# Patient Record
Sex: Male | Born: 1995
Health system: Southern US, Community
[De-identification: ages and names within clinical notes are randomized; demographics above are authoritative.]

## PROBLEM LIST (undated history)

## (undated) DIAGNOSIS — R5600 Simple febrile convulsions: Secondary | ICD-10-CM

## (undated) DIAGNOSIS — J302 Other seasonal allergic rhinitis: Secondary | ICD-10-CM

## (undated) DIAGNOSIS — S62309A Unspecified fracture of unspecified metacarpal bone, initial encounter for closed fracture: Secondary | ICD-10-CM

## (undated) HISTORY — PX: HAND SURGERY: SHX662

---
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
## 2015-01-03 ENCOUNTER — Encounter: Payer: Self-pay | Admitting: Internal Medicine

## 2015-01-03 ENCOUNTER — Ambulatory Visit (INDEPENDENT_AMBULATORY_CARE_PROVIDER_SITE_OTHER): Payer: BLUE CROSS/BLUE SHIELD | Admitting: Internal Medicine

## 2015-01-03 ENCOUNTER — Telehealth: Payer: Self-pay | Admitting: Internal Medicine

## 2015-01-03 VITALS — BP 106/62 | HR 89 | Temp 98.8°F | Ht 68.0 in | Wt 141.0 lb

## 2015-01-03 DIAGNOSIS — B2790 Infectious mononucleosis, unspecified without complication: Secondary | ICD-10-CM

## 2015-01-03 DIAGNOSIS — R509 Fever, unspecified: Secondary | ICD-10-CM | POA: Diagnosis not present

## 2015-01-03 DIAGNOSIS — R112 Nausea with vomiting, unspecified: Secondary | ICD-10-CM

## 2015-01-03 DIAGNOSIS — J029 Acute pharyngitis, unspecified: Secondary | ICD-10-CM | POA: Diagnosis not present

## 2015-01-03 LAB — CBC WITH DIFFERENTIAL/PLATELET
BASOS ABS: 0 10*3/uL (ref 0.0–0.1)
BASOS PCT: 0 % (ref 0–1)
EOS ABS: 0 10*3/uL (ref 0.0–0.7)
Eosinophils Relative: 0 % (ref 0–5)
HCT: 40.8 % (ref 39.0–52.0)
HEMOGLOBIN: 13.9 g/dL (ref 13.0–17.0)
Lymphocytes Relative: 29 % (ref 12–46)
Lymphs Abs: 2.3 10*3/uL (ref 0.7–4.0)
MCH: 30.2 pg (ref 26.0–34.0)
MCHC: 34.1 g/dL (ref 30.0–36.0)
MCV: 88.7 fL (ref 78.0–100.0)
MONOS PCT: 15 % — AB (ref 3–12)
MPV: 10.4 fL (ref 8.6–12.4)
Monocytes Absolute: 1.2 10*3/uL — ABNORMAL HIGH (ref 0.1–1.0)
NEUTROS ABS: 4.4 10*3/uL (ref 1.7–7.7)
NEUTROS PCT: 56 % (ref 43–77)
PLATELETS: 158 10*3/uL (ref 150–400)
RBC: 4.6 MIL/uL (ref 4.22–5.81)
RDW: 12.7 % (ref 11.5–15.5)
WBC: 7.8 10*3/uL (ref 4.0–10.5)

## 2015-01-03 LAB — POCT RAPID STREP A (OFFICE): RAPID STREP A SCREEN: NEGATIVE

## 2015-01-03 LAB — MONONUCLEOSIS SCREEN: MONO SCREEN: POSITIVE — AB

## 2015-01-03 MED ORDER — CLARITHROMYCIN 250 MG PO TABS: 250.0000 mg | ORAL_TABLET | Freq: Two times a day (BID) | ORAL | Status: DC

## 2015-01-03 MED ORDER — PROMETHAZINE HCL 25 MG PO TABS: 25.0000 mg | ORAL_TABLET | Freq: Three times a day (TID) | ORAL | Status: DC | PRN

## 2015-01-03 NOTE — Telephone Encounter (Signed)
Mom called, had requested note for school earlier.  Rosalita Chessman reviewed labs with her.  Went over the recommendations from Dr. Lenord Fellers for the patient.    Mom called back stating she had been in touch with the nurse at patient's college and would be in touch with the August Saucer of the college next week.  She is going to hold off on getting a note at this time (however, I had already prepared a note, so disregard note in patient's chart) for the patient.  She is going to keep him home for 7-10 days and see how he progresses and will touch base with the school nurse and then see how he's doing.  Advised that we normally do not take the patient out of school as long as they are afebrile, we allow them to return to school.  We also called in a Rx for nausea for the patient.  Mom will go and pick that up.    Advised Mom that patient should not participate in contact sports, no diving for 2 weeks.  He should get plenty of rest for the next 2 weeks and resume normal activities thereafter.  She is concerned that he may need to be seen in follow up next week.  Reassured Mom that we are here if they need, however, this is a virus and it will just take time to recover from this.  She was instructed to call the office should they need [place].

## 2015-01-03 NOTE — Patient Instructions (Signed)
Take DayQuil and Tylenol as needed. Biaxin 250 mg twice daily for 10 days. CBC and Monospot test pending. Rest and drink plenty of fluids.

## 2015-01-03 NOTE — Progress Notes (Addendum)
   Subjective:    Patient ID: Gary Hamilton, male    DOB: 20-Jul-1995, 19 y.o.   MRN: [id number]  HPI Onset Monday, October 3 of myalgias, fever, cough congestion and sore throat. Has felt awful and actually came home from school. Yesterday had temperature up to 104. Was on Fall break last week. Mother says he's had a cough for a few weeks now. No headache or stiff neck.   Review of Systems     Objective:   Physical Exam Pharynx is red without exudate. TMs are slightly full bilaterally but not red. Neck is supple without significant adenopathy. Chest is clear to auscultation without rales or wheezing       Assessment & Plan:  Acute pharyngitis-consider mononucleosis  Plan: CBC with differential and Monospot test drawn. Biaxin 250 mg twice daily for 10 days. Take with food. Rest and drink plenty of fluids. Tylenol as needed for fever.  Addendum: Monospot test is positive. CBC is within normal limits. He will take Biaxin. Out of school until Wednesday, October 12. Then he has to rest at school and not displayed in physical education or other sports activities for 2 weeks. No contact sports. No diving.

## 2015-01-03 NOTE — Addendum Note (Signed)
Addended by: Thomasena Edis on: 01/03/2015 04:24 PM   Modules accepted: Orders

## 2015-01-07 ENCOUNTER — Telehealth: Payer: Self-pay | Admitting: Internal Medicine

## 2015-01-07 NOTE — Telephone Encounter (Signed)
Mom calls this morning states that she drove patient back to [facility] yesterday so that he could attend classes.   She's staying there this week with the patient.  He is attending class and then coming back to the motel with her to rest.  States that he's still not able to keep much food down.  States that he's having terrible throat pain and has significant congestion.  States that he cannot rest at night because he's coughing the majority of the night.  Advised that with her being back there in [place], it will be difficult to manage his care over the telephone.  He likely needs something stronger for his cough than an OTC cough syrup she requested.  I advised that it would require that she pick up the prescription here at the office.  She said they would be coming home for the weekend.  Patient has a lab on Friday that he has every 2 weeks and patient stated that he CANNOT miss this lab and it lasts until 5:30 p.m.  I asked her if Monday morning was an option for the patient to be seen by Dr. Lenord Fellers prior to her taking him back to school to [place].  She said that was a possibility.    Then, advised if patient is that bad, perhaps she should take him to an Urgent Care there in the area where they are (not the school [facility]irm[facility]).  She said she would most likely go ahead and take him this afternoon to an Urgent Care and see if they recommended an antibiotic for him and cough syrup for him or steroid for the congestion and sore throat symptoms that he seems to be having.    She said that Dr. Lenord Fellers indicated that by Sunday he should begin to feel some relief and be feeling better.  She advised that he's not feeling better, he's really feeling worse if anything.  However, patient stated that he really couldn't afford to miss classes.    She will keep [place] updated on his progress and if she feels he needs to be seen on Monday, 10/17, she was instructed to call our office later in the week to get him on the calendar  for Monday morning.

## 2015-08-12 ENCOUNTER — Telehealth: Payer: Self-pay | Admitting: Internal Medicine

## 2015-08-12 NOTE — Telephone Encounter (Signed)
Pt's mom called stating that he's had a stomach bug for the last 5 days. Went to Urgent Care on 08/10/15 and was given some anti nausea and anti diarrhea medication. Pt doesn't have a fever and is no longer vomiting, but is still unable to keep anything on his stomach or eat much. Pt stated that he felt really weak. Mom was wondering if he could be tested for a bacterial infection or if this was, "one of those things that has to work through your system?" Please advise.

## 2015-08-12 NOTE — Telephone Encounter (Signed)
Called Mother. Sounds like viral gastroenteritis. Has nausea med. Must stay with clear liquids and hydrate self. Call if not better in 2 days. Dx with gastroenteritis at urgent care.

## 2018-10-05 ENCOUNTER — Telehealth: Payer: Self-pay | Admitting: Internal Medicine

## 2018-10-05 ENCOUNTER — Ambulatory Visit (INDEPENDENT_AMBULATORY_CARE_PROVIDER_SITE_OTHER): Payer: 59 | Admitting: Internal Medicine

## 2018-10-05 ENCOUNTER — Telehealth: Payer: Self-pay | Admitting: General Practice

## 2018-10-05 ENCOUNTER — Other Ambulatory Visit: Payer: Self-pay

## 2018-10-05 DIAGNOSIS — Z20822 Contact with and (suspected) exposure to covid-19: Secondary | ICD-10-CM

## 2018-10-05 DIAGNOSIS — Z20828 Contact with and (suspected) exposure to other viral communicable diseases: Secondary | ICD-10-CM

## 2018-10-05 NOTE — Telephone Encounter (Signed)
Agree 

## 2018-10-05 NOTE — Progress Notes (Signed)
   Subjective:    Patient ID: Gary Hamilton, male    DOB: 1995-09-19, 23 y.o.   MRN: [id number]  HPI  23 year old  Male seen by interactive audio and video telecommunications today due to the coronavirus pandemic.  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  Patient states that he was at the beach with his family staying in a beach time and a cousin from [place] came to visit.  Apparently cousin complained of a swimmer's ear and then came down with flulike symptoms during their stay at the beach.  Cousin left to go back to [place] on July 5th.  Patient and his family have become concerned that they were exposed to COVID-19 because cousin was tested after he returned to [place] and was positive.  Patient currently has no symptoms of COVID-19.  He is still wanting to be tested.    Review of Systems no documented fever, chills, myalgias, headache, nausea or vomiting.     Objective:   Physical Exam  Seen virtually in no acute distress      Assessment & Plan:  COVID-19 exposure  Plan: Arrange for testing at [facility] test center.  Patient should quarantine at home until test results are obtained.  Total of 20 minutes spent with patient in interviewing and history taking with virtual visit, time spent with medical decision and time spent making arrangements for testing at [facility].

## 2018-10-05 NOTE — Telephone Encounter (Addendum)
Kawon Willcutt [phone]  Gwin was at the beach with his cousin, who has tested positive for COVID-19. He left on Sunday and his cousin started running fever that afternoon. He is experiencing no symptoms at this time. He would like to be tested before going back to work. He works at the [employer] and is supposed to go back on Monday. I suggested Virtual Visit.

## 2018-10-05 NOTE — Telephone Encounter (Signed)
Called [facility] to schedule COVID - 19 testing

## 2018-10-05 NOTE — Telephone Encounter (Signed)
Gary Hamilton with Dr Ardelle Park office calling and states that Dr Renold Genta would like this patient scheduled for COVID testing. States that he was exposed this weekend at the beach while there with his mother and 7 others. States that he is not having any symptoms at this time. Please advise.

## 2018-10-05 NOTE — Telephone Encounter (Signed)
Scheduled Virtual visit °

## 2018-10-06 ENCOUNTER — Other Ambulatory Visit: Payer: Self-pay | Admitting: Internal Medicine

## 2018-10-06 DIAGNOSIS — Z20822 Contact with and (suspected) exposure to covid-19: Secondary | ICD-10-CM

## 2018-10-06 NOTE — Telephone Encounter (Signed)
error 

## 2018-10-06 NOTE — Telephone Encounter (Signed)
Contacted pt, he states he already went to the [facility] testing site around 9:30 this morning and was tested. Nothing further is needed

## 2018-10-09 ENCOUNTER — Encounter: Payer: Self-pay | Admitting: Internal Medicine

## 2018-10-09 ENCOUNTER — Other Ambulatory Visit: Payer: 59

## 2018-10-09 DIAGNOSIS — Z20822 Contact with and (suspected) exposure to covid-19: Secondary | ICD-10-CM

## 2018-10-09 NOTE — Patient Instructions (Signed)
Have arranged for COVID-19 testing at test center.  Should quarantine at home until results are received.

## 2018-10-10 ENCOUNTER — Encounter: Payer: Self-pay | Admitting: Internal Medicine

## 2018-10-10 ENCOUNTER — Telehealth (INDEPENDENT_AMBULATORY_CARE_PROVIDER_SITE_OTHER): Payer: 59 | Admitting: Internal Medicine

## 2018-10-10 DIAGNOSIS — U071 COVID-19: Secondary | ICD-10-CM

## 2018-10-10 LAB — NOVEL CORONAVIRUS, NAA: SARS-CoV-2, NAA: DETECTED — AB

## 2018-10-10 NOTE — Telephone Encounter (Signed)
Phone call to pt. His Covid 19 test is positive. He remains asymptomatic. However, he is working in a health care setting. I have suggested he remain out x 10 days and get retested on July 20th. He is to notify me if he develops any symptoms at all.  Other family members have been tested and are awaiting results but are still asymptomatic.  10 minutes spent with decision making, calling pt and discussing situation and decision making.

## 2018-10-13 ENCOUNTER — Telehealth: Payer: Self-pay | Admitting: Internal Medicine

## 2018-10-13 ENCOUNTER — Ambulatory Visit (INDEPENDENT_AMBULATORY_CARE_PROVIDER_SITE_OTHER): Payer: 59 | Admitting: Internal Medicine

## 2018-10-13 DIAGNOSIS — U071 COVID-19: Secondary | ICD-10-CM | POA: Diagnosis not present

## 2018-10-13 NOTE — Telephone Encounter (Signed)
Scheduled virtual visit °

## 2018-10-13 NOTE — Telephone Encounter (Signed)
Set up virtual visit 

## 2018-10-13 NOTE — Telephone Encounter (Signed)
Gary Hamilton [phone]  Gary called to say that he needs to get tested again, that his work is requiring 2 negative COVID test more than 24 hours apart to return to work.

## 2018-10-16 ENCOUNTER — Other Ambulatory Visit: Payer: Self-pay | Admitting: Internal Medicine

## 2018-10-16 DIAGNOSIS — U071 COVID-19: Secondary | ICD-10-CM

## 2018-10-16 NOTE — Telephone Encounter (Signed)
LVM to call office, the orders have been put in for his COVID test 1st one on Wednesday 10/18/18 and 2nd one 10/20/18. All Gary Hamilton needs to do is go between 8-3:30 for testing.

## 2018-10-17 NOTE — Patient Instructions (Signed)
Continue to self quarantine at home and we will arrange for follow-up testing as requested by your employer.  Will likely be in 2 weeks getting these test results back due to backlog of testing

## 2018-10-17 NOTE — Progress Notes (Signed)
   Subjective:    Patient ID: Gary Hamilton, male    DOB: 1995/07/05, 23 y.o.   MRN: [id number]  HPI He is seen today by interactive audio and video telecommunications due to the coronavirus pandemic and for the fact that he has COVID-19 positive.  However he is completely asymptomatic.  He is identified is [employer] using 2 identifiers and is a patient in this practice.  He is agreeable to visit in this format today.  He has been quarantining at home since he discovered his cousin with whom he was spending time at the beach tested positive for COVID-19 and had symptoms.  Gary Hamilton has never developed any symptoms at all.  His parents were tested and they were both negative.  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  Unfortunately it is taking some 10 days or more to get the results of these tests back.  He is aware of this and is aware he will be out of work probably for another 2 weeks in order to get this testing done.    Review of Systems See above    Objective:   Physical Exam  He is seen virtually and looks well.  No symptoms of COVID-19      Assessment & Plan:  Asymptomatic COVID-19 positive patient  Plan: Continue to monitor at home with self quarantine.  We will arrange for follow-up testing as he indicates he needs to return to work.  15 minutes spent with patient.  This includes medical decision making as well.

## 2018-10-18 ENCOUNTER — Other Ambulatory Visit: Payer: Self-pay | Admitting: Internal Medicine

## 2018-10-18 DIAGNOSIS — Z20822 Contact with and (suspected) exposure to covid-19: Secondary | ICD-10-CM

## 2018-10-22 LAB — NOVEL CORONAVIRUS, NAA: SARS-CoV-2, NAA: NOT DETECTED

## 2019-03-07 ENCOUNTER — Telehealth: Payer: Self-pay

## 2019-03-07 NOTE — Telephone Encounter (Signed)
-----   Message from Irene Shipper, MD sent at 03/07/2019 11:47 AM EST ----- Regarding: Office appointment this week Linda,Family friend.  His mother contacted me saying that [place] was having recurrent rectal bleeding.  Please contact Chett on his cell phone and schedule him to see me this Friday at 4 PM.  Thanks jp

## 2019-03-07 NOTE — Telephone Encounter (Signed)
Pt scheduled to see Dr. Henrene Pastor 03/09/19@4pm , pt aware of appt,

## 2019-03-09 ENCOUNTER — Ambulatory Visit: Payer: 59 | Admitting: Internal Medicine

## 2019-03-09 ENCOUNTER — Encounter: Payer: Self-pay | Admitting: Internal Medicine

## 2019-03-09 ENCOUNTER — Other Ambulatory Visit: Payer: Self-pay

## 2019-03-09 VITALS — BP 102/78 | HR 76 | Temp 98.5°F | Ht 68.0 in | Wt 155.0 lb

## 2019-03-09 DIAGNOSIS — K625 Hemorrhage of anus and rectum: Secondary | ICD-10-CM

## 2019-03-09 DIAGNOSIS — Z1159 Encounter for screening for other viral diseases: Secondary | ICD-10-CM

## 2019-03-09 MED ORDER — NA SULFATE-K SULFATE-MG SULF 17.5-3.13-1.6 GM/177ML PO SOLN: 1.0000 | Freq: Once | ORAL | 0 refills | Status: AC

## 2019-03-09 NOTE — Patient Instructions (Signed)
You have been scheduled for a colonoscopy. Please follow written instructions given to you at your visit today.  Please pick up your prep supplies at the pharmacy within the next 1-3 days. If you use inhalers (even only as needed), please bring them with you on the day of your procedure.   

## 2019-03-09 NOTE — Progress Notes (Signed)
HISTORY OF PRESENT ILLNESS:  Gary Hamilton is a 23 y.o. male with no significant past medical history.  Asymptomatic Covid positive in July (tested due to positive contact).  Presents now with recurrent rectal bleeding.  He describes having rectal bleeding 6 months ago which lasted 1 week.  Occurred with each bowel movement.  Painless.  No obvious hemorrhoids.  No change in bowel habits or constipation.  No rectal pain.  No weight loss.  Did well until 5 days ago when he experienced recurrent bleeding for 2-1/2 days.  Same characteristics.  No bleeding the past 2 days.  No family history of colon cancer or inflammatory bowel disease.  No recent blood work  REVIEW OF SYSTEMS:  All non-GI ROS negative entirely  Past Medical History:  Diagnosis Date  . Febrile seizure ([place]) as an infant   x 1  . Fracture of metacarpal 07/30/2012   right long and ring  . Seasonal allergies     Past Surgical History:  Procedure Laterality Date  . HAND SURGERY    . SHOULDER ARTHROSCOPY  2016    Social History Gary Hamilton  reports that he has never smoked. He has never used smokeless tobacco. He reports current alcohol use. He reports that he does not use drugs.  family history includes Hypertension in his mother.  No Known Allergies     PHYSICAL EXAMINATION: Vital signs: BP 102/78   Pulse 76   Temp 98.5 F (36.9 C)   Ht 5\' 8"  (1.727 m)   Wt 155 lb (70.3 kg)   BMI 23.57 kg/m   Constitutional: generally well-appearing, no acute distress Psychiatric: alert and oriented x3, cooperative Eyes: extraocular movements intact, anicteric, conjunctiva pink Mouth: oral pharynx moist, no lesions Neck: supple no lymphadenopathy Cardiovascular: heart regular rate and rhythm, no murmur Lungs: clear to auscultation bilaterally Abdomen: soft, nontender, nondistended, no obvious ascites, no peritoneal signs, normal bowel sounds, no organomegaly Rectal: Deferred until colonoscopy Extremities: no lower  extremity edema bilaterally Skin: no lesions on visible extremities Neuro: No focal deficits. No asterixis.    ASSESSMENT:  1.  Recurrent rectal bleeding of uncertain etiology.  Based on history, rule out polypoid lesion or neoplasia, most importantly.   PLAN:  1.  Colonoscopy.The nature of the procedure, as well as the risks, benefits, and alternatives were carefully and thoroughly reviewed with the patient. Ample time for discussion and questions allowed. The patient understood, was satisfied, and agreed to proceed.

## 2019-03-19 ENCOUNTER — Encounter: Payer: Self-pay | Admitting: Internal Medicine

## 2019-03-19 ENCOUNTER — Ambulatory Visit (INDEPENDENT_AMBULATORY_CARE_PROVIDER_SITE_OTHER): Payer: 59

## 2019-03-19 ENCOUNTER — Other Ambulatory Visit: Payer: Self-pay | Admitting: Internal Medicine

## 2019-03-19 DIAGNOSIS — Z1159 Encounter for screening for other viral diseases: Secondary | ICD-10-CM

## 2019-03-20 LAB — SARS CORONAVIRUS 2 (TAT 6-24 HRS): SARS Coronavirus 2: NEGATIVE

## 2019-03-21 ENCOUNTER — Encounter: Payer: Self-pay | Admitting: Internal Medicine

## 2019-03-21 ENCOUNTER — Other Ambulatory Visit: Payer: Self-pay

## 2019-03-21 ENCOUNTER — Ambulatory Visit (AMBULATORY_SURGERY_CENTER): Payer: 59 | Admitting: Internal Medicine

## 2019-03-21 VITALS — BP 102/45 | HR 78 | Temp 98.1°F | Resp 12 | Ht 68.0 in | Wt 165.0 lb

## 2019-03-21 DIAGNOSIS — K625 Hemorrhage of anus and rectum: Secondary | ICD-10-CM | POA: Diagnosis present

## 2019-03-21 MED ORDER — SODIUM CHLORIDE 0.9 % IV SOLN: 500.0000 mL | INTRAVENOUS | Status: DC

## 2019-03-21 NOTE — Patient Instructions (Signed)
Normal colonoscopy. Fiber fortified diet. Please read high fiber diet hand-out.     YOU HAD AN ENDOSCOPIC PROCEDURE TODAY AT [facility]:   Refer to the procedure report that was given to you for any specific questions about what was found during the examination.  If the procedure report does not answer your questions, please call your gastroenterologist to clarify.  If you requested that your care partner not be given the details of your procedure findings, then the procedure report has been included in a sealed envelope for you to review at your convenience later.  YOU SHOULD EXPECT: Some feelings of bloating in the abdomen. Passage of more gas than usual.  Walking can help get rid of the air that was put into your GI tract during the procedure and reduce the bloating. If you had a lower endoscopy (such as a colonoscopy or flexible sigmoidoscopy) you may notice spotting of blood in your stool or on the toilet paper. If you underwent a bowel prep for your procedure, you may not have a normal bowel movement for a few days.  Please Note:  You might notice some irritation and congestion in your nose or some drainage.  This is from the oxygen used during your procedure.  There is no need for concern and it should clear up in a day or so.  SYMPTOMS TO REPORT IMMEDIATELY:   Following lower endoscopy (colonoscopy or flexible sigmoidoscopy):  Excessive amounts of blood in the stool  Significant tenderness or worsening of abdominal pains  Swelling of the abdomen that is new, acute  Fever of 100F or higher   For urgent or emergent issues, a gastroenterologist can be reached at any hour by calling [phone].   DIET:  We do recommend a small meal at first, but then you may proceed to your regular diet.  Drink plenty of fluids but you should avoid alcoholic beverages for 24 hours.  ACTIVITY:  You should plan to take it easy for the rest of today and you should NOT DRIVE or use  heavy machinery until tomorrow (because of the sedation medicines used during the test).    FOLLOW UP: Our staff will call the number listed on your records 48-72 hours following your procedure to check on you and address any questions or concerns that you may have regarding the information given to you following your procedure. If we do not reach you, we will leave a message.  We will attempt to reach you two times.  During this call, we will ask if you have developed any symptoms of COVID 19. If you develop any symptoms (ie: fever, flu-like symptoms, shortness of breath, cough etc.) before then, please call [phone].  If you test positive for Covid 19 in the 2 weeks post procedure, please call and report this information to [place].    If any biopsies were taken you will be contacted by phone or by letter within the next 1-3 weeks.  Please call us at [phone] if you have not heard about the biopsies in 3 weeks.    SIGNATURES/CONFIDENTIALITY: You and/or your care partner have signed paperwork which will be entered into your electronic medical record.  These signatures attest to the fact that that the information above on your After Visit Summary has been reviewed and is understood.  Full responsibility of the confidentiality of this discharge information lies with you and/or your care-partner.

## 2019-03-21 NOTE — Progress Notes (Signed)
A and O x3. Report to RN. Tolerated MAC anesthesia well.

## 2019-03-21 NOTE — Op Note (Signed)
[facility] Patient Name: Gary Hamilton Procedure Date: 03/21/2019 3:09 PM MRN: [id number] Endoscopist: Wilhemina Bonito. Marina Goodell , MD Age: 23 Referring MD:  Date of Birth: 11/09/1995 Gender: Male Account #: [id number] Procedure:                Colonoscopy Indications:              Rectal bleeding. Recurrent. As recently as this                            past week Medicines:                Monitored Anesthesia Care Procedure:                Pre-Anesthesia Assessment:                           - Prior to the procedure, a History and Physical                            was performed, and patient medications and                            allergies were reviewed. The patient's tolerance of                            previous anesthesia was also reviewed. The risks                            and benefits of the procedure and the sedation                            options and risks were discussed with the patient.                            All questions were answered, and informed consent                            was obtained. Prior Anticoagulants: The patient has                            taken no previous anticoagulant or antiplatelet                            agents. ASA Grade Assessment: I - A normal, healthy                            patient. After reviewing the risks and benefits,                            the patient was deemed in satisfactory condition to                            undergo the procedure.  After obtaining informed consent, the colonoscope                            was passed under direct vision. Throughout the                            procedure, the patient's blood pressure, pulse, and                            oxygen saturations were monitored continuously. The                            Colonoscope was introduced through the anus and                            advanced to the the cecum, identified by     appendiceal orifice and ileocecal valve. The                            ileocecal valve, appendiceal orifice, and rectum                            were photographed. The quality of the bowel                            preparation was excellent. The colonoscopy was                            performed without difficulty. The patient tolerated                            the procedure well. The bowel preparation used was                            SUPREP via split dose instruction. Scope In: 3:27:27 PM Scope Out: 3:42:05 PM Scope Withdrawal Time: 0 hours 10 minutes 24 seconds  Total Procedure Duration: 0 hours 14 minutes 38 seconds  Findings:                 The entire examined colon appeared normal on direct                            and retroflexion views. Complications:            No immediate complications. Estimated blood loss:                            None. Estimated Blood Loss:     Estimated blood loss: none. Impression:               - Normal colonoscopy.                           - Internal hemorrhoid                           - No  specimens collected. Recommendation:           - Repeat colonoscopy at age 23 for screening                            purposes.                           - Patient has a contact number available for                            emergencies. The signs and symptoms of potential                            delayed complications were discussed with the                            patient. Return to normal activities tomorrow.                            Written discharge instructions were provided to the                            patient.                           - Resume previous diet. Fiber fortified.                           - Continue present medications. Wilhemina BonitoJohn N. Marina GoodellPerry, MD 03/21/2019 4:01:33 PM This report has been signed electronically.

## 2019-03-21 NOTE — Progress Notes (Signed)
Temp by JB and vitals by DT 

## 2019-03-26 ENCOUNTER — Telehealth: Payer: Self-pay | Admitting: *Deleted

## 2019-03-26 NOTE — Telephone Encounter (Signed)
  Follow up Call-  Call back number 03/21/2019  Post procedure Call Back phone  # [phone]  Permission to leave phone message Yes  Some recent data might be hidden     Patient questions:  Do you have a fever, pain , or abdominal swelling? No. Pain Score  0 *  Have you tolerated food without any problems? Yes.    Have you been able to return to your normal activities? Yes.    Do you have any questions about your discharge instructions: Diet   No. Medications  No. Follow up visit  No.  Do you have questions or concerns about your Care? No.  Actions: * If pain score is 4 or above: No action needed, pain <4.  1. Have you developed a fever since your procedure? no  2.   Have you had an respiratory symptoms (SOB or cough) since your procedure? no  3.   Have you tested positive for COVID 19 since your procedure no  4.   Have you had any family members/close contacts diagnosed with the COVID 19 since your procedure?  no   If yes to any of these questions please route to Joylene John, RN and Alphonsa Gin, Therapist, sports.

## 2019-04-18 DIAGNOSIS — M25312 Other instability, left shoulder: Secondary | ICD-10-CM | POA: Insufficient documentation

## 2019-07-20 LAB — TB SKIN TEST: TB Skin Test: NEGATIVE

## 2019-10-05 ENCOUNTER — Other Ambulatory Visit: Payer: 59 | Admitting: Internal Medicine

## 2019-10-12 ENCOUNTER — Encounter: Payer: 59 | Admitting: Internal Medicine

## 2019-11-20 ENCOUNTER — Telehealth: Payer: Self-pay | Admitting: Internal Medicine

## 2019-11-20 NOTE — Telephone Encounter (Signed)
Pt CB

## 2019-11-20 NOTE — Telephone Encounter (Signed)
Schedule OV to see results and complete

## 2019-11-20 NOTE — Telephone Encounter (Addendum)
Gary Hamilton [phone]  Gary Hamilton went to the Urgent Care at [facility] for his TB lab test. He has the results, but his school will not accept them with out a doctors signature. He would like to know if we can print something up and you sign with the results.

## 2019-11-20 NOTE — Telephone Encounter (Signed)
LVM to CB and schedule OV 

## 2019-11-23 ENCOUNTER — Encounter: Payer: Self-pay | Admitting: Internal Medicine

## 2019-11-23 ENCOUNTER — Other Ambulatory Visit: Payer: Self-pay

## 2019-11-23 ENCOUNTER — Ambulatory Visit: Payer: 59 | Admitting: Internal Medicine

## 2019-11-23 VITALS — Ht 68.0 in | Wt 151.0 lb

## 2019-11-23 DIAGNOSIS — Z0289 Encounter for other administrative examinations: Secondary | ICD-10-CM

## 2019-11-24 NOTE — Patient Instructions (Addendum)
Form validated regarding QuantiFERON testing for application to medical school.  It was signed by me personally today.  A copy of the form is in his chart.

## 2019-11-24 NOTE — Progress Notes (Signed)
Patient is applying to medical school and had to have a QuantiFERON test for TB done recently which he had at an urgent care.  Apparently, the result  has to be signed by a physician and he is requested that I do this.  He is seen today in the office in person.  Reviewed QuantiFERON test obtained from urgent care on his good charge operated by [facility] on July 20, 2019.  The result is negative.  The report appears valid containing his pertinent information.  Report was signed by me partially dated today.  His general health is excellent.  Patient had health maintenance exam here in July 2015.  He has no history of serious illnesses.  No known drug allergies.  No chronic medications.  Family history: Parents in good health.  Mother with history of hypertension.  He has history of left shoulder pain/dislocation seen by Dr. Rennis Chris.  History of rectal bleeding seen by Dr. Yancey Flemings.

## 2020-01-04 ENCOUNTER — Encounter: Payer: 59 | Admitting: Internal Medicine

## 2020-01-04 ENCOUNTER — Other Ambulatory Visit: Payer: 59 | Admitting: Internal Medicine

## 2020-01-04 ENCOUNTER — Other Ambulatory Visit: Payer: Self-pay

## 2020-01-04 DIAGNOSIS — Z13220 Encounter for screening for lipoid disorders: Secondary | ICD-10-CM

## 2020-01-04 DIAGNOSIS — Z0000 Encounter for general adult medical examination without abnormal findings: Secondary | ICD-10-CM

## 2020-01-04 LAB — LIPID PANEL
Cholesterol: 199 mg/dL (ref <200)
HDL: 62 mg/dL (ref >=40)
LDL Cholesterol (Calc): 123 mg/dL (calc) — ABNORMAL HIGH
Non-HDL Cholesterol (Calc): 137 mg/dL (calc) — ABNORMAL HIGH (ref <130)
Total CHOL/HDL Ratio: 3.2 (calc) (ref <5.0)
Triglycerides: 57 mg/dL (ref <150)

## 2020-01-04 LAB — COMPLETE METABOLIC PANEL WITH GFR
AG Ratio: 2.2 (calc) (ref 1.0–2.5)
ALT: 18 U/L (ref 9–46)
AST: 26 U/L (ref 10–40)
Albumin: 4.9 g/dL (ref 3.6–5.1)
Alkaline phosphatase (APISO): 61 U/L (ref 36–130)
BUN: 18 mg/dL (ref 7–25)
CO2: 27 mmol/L (ref 20–32)
Calcium: 10 mg/dL (ref 8.6–10.3)
Chloride: 105 mmol/L (ref 98–110)
Creat: 1.14 mg/dL (ref 0.60–1.35)
GFR, Est African American: 104 mL/min/{1.73_m2} (ref >=60)
GFR, Est Non African American: 90 mL/min/{1.73_m2} (ref >=60)
Globulin: 2.2 g/dL (calc) (ref 1.9–3.7)
Glucose, Bld: 93 mg/dL (ref 65–99)
Potassium: 4.7 mmol/L (ref 3.5–5.3)
Sodium: 140 mmol/L (ref 135–146)
Total Bilirubin: 0.6 mg/dL (ref 0.2–1.2)
Total Protein: 7.1 g/dL (ref 6.1–8.1)

## 2020-01-04 LAB — CBC WITH DIFFERENTIAL/PLATELET
Absolute Monocytes: 624 cells/uL (ref 200–950)
Basophils Absolute: 48 cells/uL (ref 0–200)
Basophils Relative: 0.8 %
Eosinophils Absolute: 108 cells/uL (ref 15–500)
Eosinophils Relative: 1.8 %
HCT: 43.2 % (ref 38.5–50.0)
Hemoglobin: 14.5 g/dL (ref 13.2–17.1)
Lymphs Abs: 2316 cells/uL (ref 850–3900)
MCH: 30.8 pg (ref 27.0–33.0)
MCHC: 33.6 g/dL (ref 32.0–36.0)
MCV: 91.7 fL (ref 80.0–100.0)
MPV: 10.1 fL (ref 7.5–12.5)
Monocytes Relative: 10.4 %
Neutro Abs: 2904 cells/uL (ref 1500–7800)
Neutrophils Relative %: 48.4 %
Platelets: 270 10*3/uL (ref 140–400)
RBC: 4.71 10*6/uL (ref 4.20–5.80)
RDW: 12.1 % (ref 11.0–15.0)
Total Lymphocyte: 38.6 %
WBC: 6 10*3/uL (ref 3.8–10.8)

## 2020-01-11 ENCOUNTER — Ambulatory Visit (INDEPENDENT_AMBULATORY_CARE_PROVIDER_SITE_OTHER): Payer: 59 | Admitting: Internal Medicine

## 2020-01-11 ENCOUNTER — Encounter: Payer: Self-pay | Admitting: Internal Medicine

## 2020-01-11 ENCOUNTER — Other Ambulatory Visit: Payer: Self-pay

## 2020-01-11 VITALS — BP 100/60 | HR 80 | Ht 68.0 in | Wt 154.0 lb

## 2020-01-11 DIAGNOSIS — Z0000 Encounter for general adult medical examination without abnormal findings: Secondary | ICD-10-CM

## 2020-01-11 DIAGNOSIS — Z23 Encounter for immunization: Secondary | ICD-10-CM | POA: Diagnosis not present

## 2020-01-11 NOTE — Progress Notes (Signed)
Subjective:    Patient ID: Gary Hamilton, male    DOB: [birth year], 24 y.o.   MRN: [id number]  HPI  24 year old Male for health maintenance exam. Feels well with no complaints.He is currently working as a Education administrator at [employer]. He likes the job. Applying to Medical schools for 2022.  No history of serious illnesses.  No known drug allergies.  No chronic medications.  Social history: Does not smoke.  Single, never married.   Has had  Hepatitis A series.  Tetanus immunization given 2014.  He has had HPV vaccines.  Received usual childhood immunizations.  These are in [place].  Flu vaccine given today.  Family history: Mother with history of hypertension.  Father with history of hyperlipidemia.  1 sister and 1 brother in good health.        Review of Systems  Respiratory: Negative.   Cardiovascular: Negative.   Gastrointestinal: Negative.   Genitourinary: Negative.   Neurological: Negative.   Psychiatric/Behavioral: Negative.        Objective:   Physical Exam Vitals reviewed.  Constitutional:      Appearance: Normal appearance.  HENT:     Head: Normocephalic and atraumatic.     Right Ear: Tympanic membrane normal.     Left Ear: Tympanic membrane normal.     Nose: Nose normal.  Eyes:     General: No scleral icterus.       Right eye: No discharge.        Left eye: No discharge.     Extraocular Movements: Extraocular movements intact.     Pupils: Pupils are equal, round, and reactive to light.  Cardiovascular:     Rate and Rhythm: Normal rate and regular rhythm.     Heart sounds: No murmur heard.   Pulmonary:     Effort: Pulmonary effort is normal.     Breath sounds: Normal breath sounds. No wheezing or rales.  Abdominal:     Palpations: Abdomen is soft. There is no mass.     Tenderness: There is no right CVA tenderness, left CVA tenderness, guarding or rebound.  Genitourinary:    Comments: No hernias to direct palpation Musculoskeletal:     Cervical back:  Neck supple. No rigidity.     Right lower leg: No edema.     Left lower leg: No edema.  Lymphadenopathy:     Cervical: No cervical adenopathy.  Skin:    General: Skin is warm and dry.     Findings: No rash.  Neurological:     General: No focal deficit present.     Mental Status: He is alert and oriented to person, place, and time.     Cranial Nerves: No cranial nerve deficit.     Motor: No weakness.     Coordination: Coordination normal.     Gait: Gait normal.  Psychiatric:        Mood and Affect: Mood normal.        Behavior: Behavior normal.        Thought Content: Thought content normal.        Judgment: Judgment normal.           Assessment & Plan:  Normal health maintenance exam  Lipid panel normal with exception of an elevated LDL of 123.  Watch diet.  He does exercise regularly.  This may be inherited from his father.  Continue to monitor and consider starting statin early if persistently elevated.  CBC and c-Met are normal.

## 2020-01-12 NOTE — Patient Instructions (Signed)
Watch fat in diet as LDL cholesterol is elevated at 123.  Normal is less than 100.  Continue exercise regimen.  Flu vaccine given.  It was a pleasure to see you today.

## 2020-07-24 DIAGNOSIS — M25512 Pain in left shoulder: Secondary | ICD-10-CM | POA: Diagnosis not present

## 2020-09-15 ENCOUNTER — Telehealth: Payer: Self-pay | Admitting: Internal Medicine

## 2020-09-15 ENCOUNTER — Other Ambulatory Visit: Payer: Self-pay

## 2020-09-15 ENCOUNTER — Encounter: Payer: Self-pay | Admitting: Internal Medicine

## 2020-09-15 ENCOUNTER — Ambulatory Visit (INDEPENDENT_AMBULATORY_CARE_PROVIDER_SITE_OTHER): Payer: BC Managed Care – PPO | Admitting: Internal Medicine

## 2020-09-15 VITALS — BP 120/70 | HR 80 | Temp 98.1°F | Ht 68.0 in | Wt 150.0 lb

## 2020-09-15 DIAGNOSIS — E7800 Pure hypercholesterolemia, unspecified: Secondary | ICD-10-CM | POA: Diagnosis not present

## 2020-09-15 DIAGNOSIS — Z1159 Encounter for screening for other viral diseases: Secondary | ICD-10-CM | POA: Diagnosis not present

## 2020-09-15 NOTE — Telephone Encounter (Signed)
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 I did ask if he needed form filled out and he said he did not think so, just something saying he was positive for Hep B antibody titer  - Need positive Hep B antibody Titer prior to med school - Lipid panel (recheck LDL) - Would like to get vision checked. I think I may be nearsighted

## 2020-09-15 NOTE — Telephone Encounter (Signed)
scheduled

## 2020-09-15 NOTE — Patient Instructions (Signed)
Patient to return later this week for fasting lipid panel and to obtain hepatitis B surface antibody serology.  This is for entrance to medical school.

## 2020-09-15 NOTE — Progress Notes (Signed)
   Subjective:    Patient ID: Gary Hamilton, male    DOB: Mar 20, 1996, 25 y.o.   MRN: [id number]  HPI Pleasant 25 year old Male has been accepted to Medical School at [employer] and will be leaving later this week to get settled in [place], [place] before classes start.  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  Says that he needs Hepatitis B surface antibody test and this will be drawn.  He has had 3 COVID-19 immunizations.  Had Tdap in 2014.  He had hepatitis B series in 1997.  In 2021 he had mild elevation of LDL at 123.  Total cholesterol was 199 and HDL cholesterol was 62 with triglycerides being normal at 57.  He would like to have repeat lipid panel.  Has been watching his weight and working out some.  Aside from these to request he does not need any other labs for interest to medical school he says.  Review of Systems see above     Objective:   Physical Exam Blood pressure 120/70, pulse 80 temperature 98.1 degrees pulse oximetry 98% weight 150 pounds height 5 feet 8 inches BMI 22.81.  Not examined.  Affect thought and judgment are normal.       Assessment & Plan:  Needs confirmation of positive Hepatitis B surface antibody having had hepatitis B series.  This will be drawn in the near future along with fasting lipid panel as he has a history of mild elevation of LDL at 123.

## 2020-09-16 ENCOUNTER — Other Ambulatory Visit: Payer: BC Managed Care – PPO | Admitting: Internal Medicine

## 2020-09-16 DIAGNOSIS — E7800 Pure hypercholesterolemia, unspecified: Secondary | ICD-10-CM | POA: Diagnosis not present

## 2020-09-16 DIAGNOSIS — Z1159 Encounter for screening for other viral diseases: Secondary | ICD-10-CM | POA: Diagnosis not present

## 2020-09-17 LAB — LIPID PANEL
Cholesterol: 194 mg/dL (ref <200)
HDL: 66 mg/dL (ref >=40)
LDL Cholesterol (Calc): 112 mg/dL (calc) — ABNORMAL HIGH
Non-HDL Cholesterol (Calc): 128 mg/dL (calc) (ref <130)
Total CHOL/HDL Ratio: 2.9 (calc) (ref <5.0)
Triglycerides: 70 mg/dL (ref <150)

## 2020-09-17 LAB — HEPATITIS B SURFACE ANTIBODY,QUALITATIVE: Hep B S Ab: NONREACTIVE

## 2020-10-20 DIAGNOSIS — Z111 Encounter for screening for respiratory tuberculosis: Secondary | ICD-10-CM | POA: Diagnosis not present

## 2020-10-30 DIAGNOSIS — Z23 Encounter for immunization: Secondary | ICD-10-CM | POA: Diagnosis not present

## 2020-12-12 DIAGNOSIS — Z23 Encounter for immunization: Secondary | ICD-10-CM | POA: Diagnosis not present

## 2021-01-15 DIAGNOSIS — Z0184 Encounter for antibody response examination: Secondary | ICD-10-CM | POA: Diagnosis not present

## 2021-01-31 DIAGNOSIS — Z1330 Encounter for screening examination for mental health and behavioral disorders, unspecified: Secondary | ICD-10-CM | POA: Diagnosis not present

## 2021-01-31 DIAGNOSIS — J069 Acute upper respiratory infection, unspecified: Secondary | ICD-10-CM | POA: Diagnosis not present

## 2021-01-31 DIAGNOSIS — Z20828 Contact with and (suspected) exposure to other viral communicable diseases: Secondary | ICD-10-CM | POA: Diagnosis not present

## 2021-07-02 ENCOUNTER — Encounter: Payer: Self-pay | Admitting: Internal Medicine

## 2021-07-02 ENCOUNTER — Ambulatory Visit: Payer: BC Managed Care – PPO | Admitting: Internal Medicine

## 2021-07-02 VITALS — BP 122/80 | HR 116 | Temp 99.1°F | Wt 148.8 lb

## 2021-07-02 DIAGNOSIS — J22 Unspecified acute lower respiratory infection: Secondary | ICD-10-CM

## 2021-07-02 MED ORDER — AZITHROMYCIN 250 MG PO TABS: ORAL_TABLET | ORAL | 0 refills | Status: AC

## 2021-07-02 MED ORDER — HYDROCODONE BIT-HOMATROP MBR 5-1.5 MG/5ML PO SOLN: 5.0000 mL | Freq: Three times a day (TID) | ORAL | 0 refills | Status: DC | PRN

## 2021-07-02 NOTE — Telephone Encounter (Signed)
Talked with Ly, has productive cough, chest congestion, tested negative this morning for COVID, scheduled him for 4:30 today ?

## 2021-07-02 NOTE — Progress Notes (Signed)
? ?  Subjective:  ? ? Patient ID: Gary Hamilton, male    DOB: Apr 26, 1995, 26 y.o.   MRN: [id number] ? 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  He is home from medical school for the Easter holiday.  He says 3 days ago he had flulike symptoms of cough, chills and myalgias.  He tested negative for COVID twice with home test kits.  Despite this he has a persistent productive cough with discolored sputum.  It is worse at night and is hard to sleep.  He took some Lawyer but has not improved.  Office visit was advised. ? ?His general health is excellent.  No history of serious illnesses.  No known drug allergies.  No chronic medications.  Does not smoke.  He is single. ? ?Family history: Mother with history of hypertension.  Father with history of hyperlipidemia.  1 sister and 1 brother in good health. ? ? ? ?Review of Systems see above ? ?   ?Objective:  ? Physical Exam ?Blood pressure 122/88 left arm sitting pulse 116 but I think that is due to anxiety. Temperature 99.1 degrees pulse oximetry 97% weight 148 pounds 12.8 ounces BMI 22.62 ? ?Skin warm and dry.  No cervical adenopathy.  TMs and pharynx clear.  Neck supple.  Chest clear to auscultation without rales or wheezing. ? ?   ?Assessment & Plan:  ?Acute lower respiratory infection ? ?Acute viral syndrome ? ?Plan: Zithromax Z-PAK 2 tabs day 1 followed by 1 tab days 2 through 5.  May take Hycodan 1 teaspoon every 8 hours as needed for cough.  Rest and drink fluids.  Call if not better in 7 days or sooner if worse. ? ?

## 2021-07-19 NOTE — Patient Instructions (Signed)
Rest and drink fluids.  May take Hycodan 1 teaspoon every 8 hours if needed for cough.  Call if not better in 7 days or sooner if worse.  Take Zithromax Z-PAK 2 tabs day 1 followed by 1 tab days 2 through 5. ?

## 2021-08-15 DIAGNOSIS — W19XXXA Unspecified fall, initial encounter: Secondary | ICD-10-CM | POA: Diagnosis not present

## 2021-08-15 DIAGNOSIS — R569 Unspecified convulsions: Secondary | ICD-10-CM | POA: Diagnosis not present

## 2021-08-15 DIAGNOSIS — M25512 Pain in left shoulder: Secondary | ICD-10-CM | POA: Diagnosis not present

## 2021-08-15 DIAGNOSIS — R202 Paresthesia of skin: Secondary | ICD-10-CM | POA: Diagnosis not present

## 2021-08-15 DIAGNOSIS — S4992XA Unspecified injury of left shoulder and upper arm, initial encounter: Secondary | ICD-10-CM | POA: Diagnosis not present

## 2021-08-16 DIAGNOSIS — R55 Syncope and collapse: Secondary | ICD-10-CM | POA: Diagnosis not present

## 2021-08-18 ENCOUNTER — Telehealth: Payer: Self-pay | Admitting: Internal Medicine

## 2021-08-18 ENCOUNTER — Encounter: Payer: Self-pay | Admitting: Internal Medicine

## 2021-08-18 NOTE — Telephone Encounter (Signed)
I connected with patient today by telephone after he sent a MyChart message requesting an appointment for evaluation regarding a seizure that he had this past Friday night.  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  He was at his home in [place] watching TV on the couch and suffered an apparent generalized seizure.  He has no prior history of seizure disorder.  He had no history of head trauma.  He went to the emergency department at [facility] in [place] and was evaluated.  He said he waited a long time and an MRI or CT of the brain was not performed.  Apparently some labs were drawn and we will try to get those records off of Care Everywhere.  He is requesting evaluation in [place].  He will be given an appointment for May 30 as he knows he is off that day.  He is currently doing some research at [employer].  Explained that he would be referred to a Neurologist and it may take a couple of weeks to get an appointment.  Since this appears to be an unprovoked seizure, it may be best if he does not drive until he is evaluated by Neurologist.

## 2021-08-25 ENCOUNTER — Ambulatory Visit (INDEPENDENT_AMBULATORY_CARE_PROVIDER_SITE_OTHER): Payer: BC Managed Care – PPO | Admitting: Internal Medicine

## 2021-08-25 ENCOUNTER — Encounter: Payer: Self-pay | Admitting: Internal Medicine

## 2021-08-25 VITALS — BP 98/70 | HR 67 | Temp 97.3°F | Ht 68.5 in | Wt 147.5 lb

## 2021-08-25 DIAGNOSIS — Z136 Encounter for screening for cardiovascular disorders: Secondary | ICD-10-CM

## 2021-08-25 DIAGNOSIS — Z0000 Encounter for general adult medical examination without abnormal findings: Secondary | ICD-10-CM

## 2021-08-25 DIAGNOSIS — G40909 Epilepsy, unspecified, not intractable, without status epilepticus: Secondary | ICD-10-CM

## 2021-08-25 DIAGNOSIS — R5383 Other fatigue: Secondary | ICD-10-CM | POA: Diagnosis not present

## 2021-08-25 LAB — POCT URINALYSIS DIPSTICK
Bilirubin, UA: NEGATIVE
Blood, UA: NEGATIVE
Glucose, UA: NEGATIVE
Ketones, UA: NEGATIVE
Leukocytes, UA: NEGATIVE
Nitrite, UA: NEGATIVE
Protein, UA: NEGATIVE
Spec Grav, UA: 1.01 (ref 1.010–1.025)
Urobilinogen, UA: 0.2 E.U./dL
pH, UA: 7.5 (ref 5.0–8.0)

## 2021-08-25 NOTE — Progress Notes (Signed)
   Subjective:    Patient ID: Gary Hamilton, male    DOB: 07-28-1995, 26 y.o.   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 Patient does not take any chronic meds. His general health is excellent. Social alcohol consumption. Had not had alcohol on the evening of the seizure but was tired from finishing up first year of medical school. Girlfriend witnessed the seizure and called EMS.  Patient was seen in the Emergency Department at [facility] in [place], [place].  He was seen by a physician.  He had no focal deficits on his exam.  Basic metabolic panel, magnesium, troponin 1 and CBC with differential were essentially normal.  His hemoglobin was 13.3 g and a year ago was 14.5 g.  6 years ago hemoglobin was 13.9 g.  Imaging was not done.  It was a busy night in the Emergency department and he had to wait a long time for evaluation.  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  No neurological issues.  No visual disturbance.  No headaches.  No vomiting.  He had a fasting lipid panel done June 2022 and LDL was slightly elevated at 112 but otherwise the entire panel was normal.  In October 2021 his LDL was 123.  He watches his weight and stays in shape.  Social history: Does not smoke.  Single, never married.  Social alcohol consumption but never to the excess.  Past medical history: No history of serious illnesses.  No chronic medications.  No known drug allergies.  Family history: Mother with history of hypertension.  Father with history of hyperlipidemia.  1 brother and 1 sister both in excellent health.  Review of Systems no headache, URI symptoms, shortness of breath, chest pain, malaise or fatigue     Objective:   Physical Exam Blood pressure 98/70 pulse 67 temperature 97.3  degrees pulse oximetry 99% ,weight 147 pounds, 8 ounces BMI 22.10   Neck is supple.  No JVD thyromegaly or carotid bruits.  TMs are clear.  Chest clear.  Cardiac exam: Occasional irregular contractions.  EKG shows occasional PVCs. There also appears to be intermittent PACs on monitor.  No hepatosplenomegaly masses or tenderness on abdominal exam.  Neuro exam: He is alert and oriented x3.  Muscle strength is normal.  PERRLA and extraocular movements are full.  Gait is normal.  Cranial nerves II through XII are grossly intact.       Assessment & Plan:  First time seizure-generalized- occurring late evening on Friday, May 19.  Cannot identify any provoking factors that would have caused the seizure by history.  Plan: He needs evaluation by Neurology and he needs imaging of his brain with contrast.  I have ordered an MRI of the brain with contrast.  Neurology consultation will be obtained here in [place] at [facility]

## 2021-08-25 NOTE — Patient Instructions (Addendum)
Have ordered an MRI of the brain with contrast for history of new onset seizure.  Referral to Neurology.  Labs drawn and pending.

## 2021-08-26 LAB — TSH: TSH: 1.2 mIU/L (ref 0.40–4.50)

## 2021-08-26 LAB — COMPLETE METABOLIC PANEL WITH GFR
AG Ratio: 2 (calc) (ref 1.0–2.5)
ALT: 17 U/L (ref 9–46)
AST: 17 U/L (ref 10–40)
Albumin: 4.3 g/dL (ref 3.6–5.1)
Alkaline phosphatase (APISO): 83 U/L (ref 36–130)
BUN: 17 mg/dL (ref 7–25)
CO2: 24 mmol/L (ref 20–32)
Calcium: 9.5 mg/dL (ref 8.6–10.3)
Chloride: 107 mmol/L (ref 98–110)
Creat: 0.95 mg/dL (ref 0.60–1.24)
Globulin: 2.1 g/dL (calc) (ref 1.9–3.7)
Glucose, Bld: 100 mg/dL — ABNORMAL HIGH (ref 65–99)
Potassium: 4.9 mmol/L (ref 3.5–5.3)
Sodium: 141 mmol/L (ref 135–146)
Total Bilirubin: 0.5 mg/dL (ref 0.2–1.2)
Total Protein: 6.4 g/dL (ref 6.1–8.1)
eGFR: 113 mL/min/{1.73_m2} (ref >=60)

## 2021-08-26 LAB — LIPID PANEL
Cholesterol: 172 mg/dL (ref <200)
HDL: 59 mg/dL (ref >=40)
LDL Cholesterol (Calc): 97 mg/dL (calc)
Non-HDL Cholesterol (Calc): 113 mg/dL (calc) (ref <130)
Total CHOL/HDL Ratio: 2.9 (calc) (ref <5.0)
Triglycerides: 70 mg/dL (ref <150)

## 2021-08-26 LAB — T4, FREE: Free T4: 0.9 ng/dL (ref 0.8–1.8)

## 2021-08-26 NOTE — Addendum Note (Signed)
Addended by: Jama Flavors on: 08/26/2021 09:06 AM   Modules accepted: Orders

## 2021-08-27 ENCOUNTER — Ambulatory Visit
Admission: RE | Admit: 2021-08-27 | Discharge: 2021-08-27 | Disposition: A | Discharge status: Home / Self Care | Payer: BC Managed Care – PPO | Source: Ambulatory Visit | Attending: Internal Medicine | Admitting: Internal Medicine

## 2021-08-27 DIAGNOSIS — R569 Unspecified convulsions: Secondary | ICD-10-CM | POA: Diagnosis not present

## 2021-08-27 MED ORDER — GADOBENATE DIMEGLUMINE 529 MG/ML IV SOLN
13.0000 mL | Freq: Once | INTRAVENOUS | Status: AC | PRN
  Administered 2021-08-27: 13 mL via INTRAVENOUS

## 2021-09-03 ENCOUNTER — Ambulatory Visit: Payer: BC Managed Care – PPO | Admitting: Neurology

## 2021-09-03 ENCOUNTER — Encounter: Payer: Self-pay | Admitting: Neurology

## 2021-09-03 VITALS — BP 109/70 | HR 56 | Ht 68.0 in | Wt 150.0 lb

## 2021-09-03 DIAGNOSIS — G40909 Epilepsy, unspecified, not intractable, without status epilepticus: Secondary | ICD-10-CM

## 2021-09-03 DIAGNOSIS — Z4789 Encounter for other orthopedic aftercare: Secondary | ICD-10-CM | POA: Diagnosis not present

## 2021-09-03 DIAGNOSIS — M25512 Pain in left shoulder: Secondary | ICD-10-CM | POA: Diagnosis not present

## 2021-09-03 NOTE — Patient Instructions (Signed)
Patient EEG, I will contact you to go over the result Follow with your primary care doctor Contact me if you have another seizure Discussed driving restriction for the next 6 months Return as needed

## 2021-09-03 NOTE — Progress Notes (Signed)
[facility]  PATIENT: Gary Hamilton DOB: 12-01-1995  REQUESTING CLINICIAN: Margaree Mackintosh, MD HISTORY FROM: Patient  REASON FOR VISIT: Seizure     HISTORICAL  CHIEF COMPLAINT:  Chief Complaint  Patient presents with   New Patient (Initial Visit)    Rm 12. Alone. NP/Internal urgent referral for new onset seizures.    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  Patient reports a history of febrile seizure at the age of 4 but otherwise has been doing fine, denies any history of staring spells.  He reported on May 20, he was at home watching TV and the next thing that he remembers is waking up with paramedics in his house.  He was told by girlfriend that he had a generalized convulsion with foaming at the mouth and eyes rolled back.  He denies any tongue biting and no urinary incontinence.  From the seizure he hurt his left shoulder, currently seeing a orthopedist.  Patient denies any seizure risk factors other than febrile seizure at the age of 60, no family history of seizures, and denies any history of brain trauma.  Handedness: Right handed   Onset: May 20   Seizure Type: Generalized convulsion  Current frequency: Only once  Any injuries from seizures: Left shoulder injury  Seizure risk factors: Febrile seizure at the age of 4   Previous ASMs: None  Currenty ASMs: None  ASMs side effects: Not applicable  Brain Images: Normal MRI brain  Previous EEGs: Not yet completed   OTHER MEDICAL CONDITIONS: None   REVIEW OF SYSTEMS: Full 14 system review of systems performed and negative with exception of: as noted in the HPI   ALLERGIES: No Known Allergies  HOME MEDICATIONS: No outpatient medications prior to visit.   No facility-administered medications prior to visit.    PAST MEDICAL HISTORY: Past Medical History:  Diagnosis Date   Febrile seizure (HCC)  as an infant   x 1   Fracture of metacarpal 07/30/2012   right long and ring   Seasonal allergies     PAST SURGICAL HISTORY: Past Surgical History:  Procedure Laterality Date   HAND SURGERY     SHOULDER ARTHROSCOPY  2016    FAMILY HISTORY: Family History  Problem Relation Age of Onset   Hypertension Mother    Colon cancer Neg Hx    Stomach cancer Neg Hx    Pancreatic cancer Neg Hx    Esophageal cancer Neg Hx     SOCIAL HISTORY: Social History   Socioeconomic History   Marital status: Single    Spouse name: Not on file   Number of children: Not on file   Years of education: Not on file   Highest education level: Not on file  Occupational History   Not on file  Tobacco Use   Smoking status: Never   Smokeless tobacco: Never  Vaping Use   Vaping Use: Never used  Substance and Sexual Activity   Alcohol use: Yes    Comment: Occ   Drug use: No   Sexual activity: Not on file  Other Topics Concern   Not on file  Social History Narrative   Not on file   Social Determinants of Health   Financial Resource Strain: Not on file  Food Insecurity: Not on file  Transportation Needs: Not on file  Physical Activity: Not on file  Stress: Not on file  Social  Connections: Not on file  Intimate Partner Violence: Not on file    PHYSICAL EXAM  GENERAL EXAM/CONSTITUTIONAL: Vitals:  Vitals:   09/03/21 1041  BP: 109/70  Pulse: (!) 56  Weight: 150 lb (68 kg)  Height:  (1.727 m)   Body mass index is 22.81 kg/m. Wt Readings from Last 3 Encounters:  09/03/21 150 lb (68 kg)  08/25/21 147 lb 8 oz (66.9 kg)  07/02/21 148 lb 12.8 oz (67.5 kg)   Patient is in no distress; well developed, nourished and groomed; neck is supple  EYES: Pupils round and reactive to light, Visual fields full to confrontation, Extraocular movements intacts,  No results found.  MUSCULOSKELETAL: Gait, strength, tone, movements noted in Neurologic exam below  NEUROLOGIC: MENTAL STATUS:       No data to display         awake, alert, oriented to person, place and time recent and remote memory intact normal attention and concentration language fluent, comprehension intact, naming intact fund of knowledge appropriate  CRANIAL NERVE:  2nd, 3rd, 4th, 6th - pupils equal and reactive to light, visual fields full to confrontation, extraocular muscles intact, no nystagmus 5th - facial sensation symmetric 7th - facial strength symmetric 8th - hearing intact 9th - palate elevates symmetrically, uvula midline 11th - shoulder shrug symmetric 12th - tongue protrusion midline  MOTOR:  normal bulk and tone, full strength in the BUE, BLE  SENSORY:  normal and symmetric to light touch  COORDINATION:  finger-nose-finger, fine finger movements normal  REFLEXES:  deep tendon reflexes present and symmetric  GAIT/STATION:  normal   DIAGNOSTIC DATA (LABS, IMAGING, TESTING) - I reviewed patient records, labs, notes, testing and imaging myself where available.  Lab Results  Component Value Date   WBC 6.0 01/04/2020   HGB 14.5 01/04/2020   HCT 43.2 01/04/2020   MCV 91.7 01/04/2020   PLT 270 01/04/2020      Component Value Date/Time   NA 141 08/25/2021 1446   K 4.9 08/25/2021 1446   CL 107 08/25/2021 1446   CO2 24 08/25/2021 1446   GLUCOSE 100 (H) 08/25/2021 1446   BUN 17 08/25/2021 1446   CREATININE 0.95 08/25/2021 1446   CALCIUM 9.5 08/25/2021 1446   PROT 6.4 08/25/2021 1446   AST 17 08/25/2021 1446   ALT 17 08/25/2021 1446   BILITOT 0.5 08/25/2021 1446   GFRNONAA 90 01/04/2020 0954   GFRAA 104 01/04/2020 0954   Lab Results  Component Value Date   CHOL 172 08/25/2021   HDL 59 08/25/2021   LDLCALC 97 08/25/2021   TRIG 70 08/25/2021   No results found for: "HGBA1C" No results found for: "VITAMINB12" Lab Results  Component Value Date   TSH 1.20 08/25/2021    MRI Brain with and without contrast 08/27/21 Unremarkable brain MRI with no acute intracranial  pathology or epileptogenic focus.   ASSESSMENT AND PLAN  26 y.o. year old male  with no reported past medical history who is presenting after first generalized convulsion.  Patient reported history of febrile seizure at the age of 4 but since then has been doing well, denies any history of staring spells, denies any history of dropping things in the morning and no history of myoclonic jerks.  He did have a MRI brain with and without contrast which was negative and pending a routine EEG.  I will contact the patient after completion of the routine EEG, advised him to contact me if he has another seizure and  also discussed driving restriction for the next 6 months.  He voices understanding.  Continue to follow with your primary care doctor and return as needed.   1. Seizure disorder [facility](HCC)     Patient Instructions  Patient EEG, I will contact you to go over the result Follow with your primary care doctor Contact me if you have another seizure Discussed driving restriction for the next 6 months Return as needed   Per  [place] statutes, patients with seizures are not allowed to drive until they have been seizure-free for six months.  Other recommendations include using caution when using heavy equipment or power tools. Avoid working on ladders or at heights. Take showers instead of baths.  Do not swim alone.  Ensure the water temperature is not too high on the home water heater. Do not go swimming alone. Do not lock yourself in a room alone (i.e. bathroom). When caring for infants or small children, sit down when holding, feeding, or changing them to minimize risk of injury to the child in the event you have a seizure. Maintain good sleep hygiene. Avoid alcohol.  Also recommend adequate sleep, hydration, good diet and minimize stress.   During the Seizure  - First, ensure adequate ventilation and place patients on the floor on their left side  Loosen clothing around the neck and ensure  the airway is patent. If the patient is clenching the teeth, do not force the mouth open with any object as this can cause severe damage - Remove all items from the surrounding that can be hazardous. The patient may be oblivious to what's happening and may not even know what he or she is doing. If the patient is confused and wandering, either gently guide him/her away and block access to outside areas - Reassure the individual and be comforting - Call 911. In most cases, the seizure ends before EMS arrives. However, there are cases when seizures may last over 3 to 5 minutes. Or the individual may have developed breathing difficulties or severe injuries. If a pregnant patient or a person with diabetes develops a seizure, it is prudent to call an ambulance. - Finally, if the patient does not regain full consciousness, then call EMS. Most patients will remain confused for about 45 to 90 minutes after a seizure, so you must use judgment in calling for help. - Avoid restraints but make sure the patient is in a bed with padded side rails - Place the individual in a lateral position with the neck slightly flexed; this will help the saliva drain from the mouth and prevent the tongue from falling backward - Remove all nearby furniture and other hazards from the area - Provide verbal assurance as the individual is regaining consciousness - Provide the patient with privacy if possible - Call for help and start treatment as ordered by the caregiver   After the Seizure (Postictal Stage)  After a seizure, most patients experience confusion, fatigue, muscle pain and/or a headache. Thus, one should permit the individual to sleep. For the next few days, reassurance is essential. Being calm and helping reorient the person is also of importance.  Most seizures are painless and end spontaneously. Seizures are not harmful to others but can lead to complications such as stress on the lungs, brain and the heart.  Individuals with prior lung problems may develop labored breathing and respiratory distress.     Orders Placed This Encounter  Procedures   EEG adult    No orders  of the defined types were placed in this encounter.   Return if symptoms worsen or fail to improve.    Windell Norfolk, MD 09/03/2021, 11:04 AM  [facility] [address] [place], [place] [address] [phone]

## 2021-09-08 ENCOUNTER — Ambulatory Visit: Payer: BC Managed Care – PPO | Admitting: Neurology

## 2021-09-08 DIAGNOSIS — M25512 Pain in left shoulder: Secondary | ICD-10-CM | POA: Diagnosis not present

## 2021-09-08 DIAGNOSIS — G40909 Epilepsy, unspecified, not intractable, without status epilepticus: Secondary | ICD-10-CM

## 2021-09-08 NOTE — Procedures (Signed)
    History:  26 year old man with seizure    EEG classification: Awake and drowsy  Description of the recording: The background rhythms of this recording consists of a fairly well modulated medium amplitude alpha rhythm of 10 Hz that is reactive to eye opening and closure. As the record progresses, the patient appears to remain in the waking state throughout the recording. Photic stimulation was performed, did not show any abnormalities. Hyperventilation was also performed, did not show any abnormalities. Toward the end of the recording, the patient enters the drowsy state with slight symmetric slowing seen. The patient never enters stage II sleep. No abnormal epileptiform discharges seen during this recording. There was no focal slowing. EKG monitor shows no evidence of cardiac rhythm abnormalities with a heart rate of 60.  Abnormality: None   Impression: This is a normal EEG recording in the waking and drowsy state. No evidence of interictal epileptiform discharges seen. A normal EEG does not exclude a diagnosis of epilepsy.    Gary Norfolk, MD [facility]

## 2021-10-13 DIAGNOSIS — Z4789 Encounter for other orthopedic aftercare: Secondary | ICD-10-CM | POA: Diagnosis not present

## 2021-10-13 DIAGNOSIS — I890 Lymphedema, not elsewhere classified: Secondary | ICD-10-CM | POA: Diagnosis not present

## 2021-10-13 DIAGNOSIS — G8918 Other acute postprocedural pain: Secondary | ICD-10-CM | POA: Diagnosis not present

## 2021-10-13 DIAGNOSIS — M25512 Pain in left shoulder: Secondary | ICD-10-CM | POA: Diagnosis not present

## 2021-10-13 DIAGNOSIS — M25312 Other instability, left shoulder: Secondary | ICD-10-CM | POA: Diagnosis not present

## 2021-10-21 DIAGNOSIS — Z4789 Encounter for other orthopedic aftercare: Secondary | ICD-10-CM | POA: Diagnosis not present

## 2021-10-22 DIAGNOSIS — M25512 Pain in left shoulder: Secondary | ICD-10-CM | POA: Diagnosis not present

## 2021-10-22 DIAGNOSIS — M25312 Other instability, left shoulder: Secondary | ICD-10-CM | POA: Diagnosis not present

## 2021-10-22 DIAGNOSIS — Z4789 Encounter for other orthopedic aftercare: Secondary | ICD-10-CM | POA: Diagnosis not present

## 2021-10-22 DIAGNOSIS — I890 Lymphedema, not elsewhere classified: Secondary | ICD-10-CM | POA: Diagnosis not present

## 2021-10-23 DIAGNOSIS — I890 Lymphedema, not elsewhere classified: Secondary | ICD-10-CM | POA: Diagnosis not present

## 2021-10-23 DIAGNOSIS — M25312 Other instability, left shoulder: Secondary | ICD-10-CM | POA: Diagnosis not present

## 2021-10-23 DIAGNOSIS — M25512 Pain in left shoulder: Secondary | ICD-10-CM | POA: Diagnosis not present

## 2021-10-23 DIAGNOSIS — Z4789 Encounter for other orthopedic aftercare: Secondary | ICD-10-CM | POA: Diagnosis not present

## 2021-10-25 DIAGNOSIS — I890 Lymphedema, not elsewhere classified: Secondary | ICD-10-CM | POA: Diagnosis not present

## 2021-10-25 DIAGNOSIS — M25512 Pain in left shoulder: Secondary | ICD-10-CM | POA: Diagnosis not present

## 2021-10-25 DIAGNOSIS — M25312 Other instability, left shoulder: Secondary | ICD-10-CM | POA: Diagnosis not present

## 2021-10-25 DIAGNOSIS — Z4789 Encounter for other orthopedic aftercare: Secondary | ICD-10-CM | POA: Diagnosis not present

## 2021-10-26 DIAGNOSIS — I890 Lymphedema, not elsewhere classified: Secondary | ICD-10-CM | POA: Diagnosis not present

## 2021-10-26 DIAGNOSIS — M25312 Other instability, left shoulder: Secondary | ICD-10-CM | POA: Diagnosis not present

## 2021-10-26 DIAGNOSIS — Z4789 Encounter for other orthopedic aftercare: Secondary | ICD-10-CM | POA: Diagnosis not present

## 2021-10-26 DIAGNOSIS — M25512 Pain in left shoulder: Secondary | ICD-10-CM | POA: Diagnosis not present

## 2021-11-04 DIAGNOSIS — M6281 Muscle weakness (generalized): Secondary | ICD-10-CM | POA: Diagnosis not present

## 2021-11-04 DIAGNOSIS — Z139 Encounter for screening, unspecified: Secondary | ICD-10-CM | POA: Diagnosis not present

## 2021-11-04 DIAGNOSIS — M25612 Stiffness of left shoulder, not elsewhere classified: Secondary | ICD-10-CM | POA: Diagnosis not present

## 2021-11-04 DIAGNOSIS — M25512 Pain in left shoulder: Secondary | ICD-10-CM | POA: Diagnosis not present

## 2021-11-11 DIAGNOSIS — M25512 Pain in left shoulder: Secondary | ICD-10-CM | POA: Diagnosis not present

## 2021-11-11 DIAGNOSIS — M6281 Muscle weakness (generalized): Secondary | ICD-10-CM | POA: Diagnosis not present

## 2021-11-11 DIAGNOSIS — M25612 Stiffness of left shoulder, not elsewhere classified: Secondary | ICD-10-CM | POA: Diagnosis not present

## 2021-11-16 DIAGNOSIS — M25612 Stiffness of left shoulder, not elsewhere classified: Secondary | ICD-10-CM | POA: Diagnosis not present

## 2021-11-16 DIAGNOSIS — M25512 Pain in left shoulder: Secondary | ICD-10-CM | POA: Diagnosis not present

## 2021-11-16 DIAGNOSIS — M6281 Muscle weakness (generalized): Secondary | ICD-10-CM | POA: Diagnosis not present

## 2021-11-18 DIAGNOSIS — M6281 Muscle weakness (generalized): Secondary | ICD-10-CM | POA: Diagnosis not present

## 2021-11-18 DIAGNOSIS — M25612 Stiffness of left shoulder, not elsewhere classified: Secondary | ICD-10-CM | POA: Diagnosis not present

## 2021-11-18 DIAGNOSIS — M25512 Pain in left shoulder: Secondary | ICD-10-CM | POA: Diagnosis not present

## 2021-12-02 DIAGNOSIS — M6281 Muscle weakness (generalized): Secondary | ICD-10-CM | POA: Diagnosis not present

## 2021-12-02 DIAGNOSIS — M25512 Pain in left shoulder: Secondary | ICD-10-CM | POA: Diagnosis not present

## 2021-12-02 DIAGNOSIS — M25612 Stiffness of left shoulder, not elsewhere classified: Secondary | ICD-10-CM | POA: Diagnosis not present

## 2021-12-04 DIAGNOSIS — M6281 Muscle weakness (generalized): Secondary | ICD-10-CM | POA: Diagnosis not present

## 2021-12-04 DIAGNOSIS — M25612 Stiffness of left shoulder, not elsewhere classified: Secondary | ICD-10-CM | POA: Diagnosis not present

## 2021-12-04 DIAGNOSIS — M25512 Pain in left shoulder: Secondary | ICD-10-CM | POA: Diagnosis not present

## 2021-12-07 DIAGNOSIS — M25612 Stiffness of left shoulder, not elsewhere classified: Secondary | ICD-10-CM | POA: Diagnosis not present

## 2021-12-07 DIAGNOSIS — M6281 Muscle weakness (generalized): Secondary | ICD-10-CM | POA: Diagnosis not present

## 2021-12-07 DIAGNOSIS — M25512 Pain in left shoulder: Secondary | ICD-10-CM | POA: Diagnosis not present

## 2021-12-08 DIAGNOSIS — Z111 Encounter for screening for respiratory tuberculosis: Secondary | ICD-10-CM | POA: Diagnosis not present

## 2021-12-09 DIAGNOSIS — M6281 Muscle weakness (generalized): Secondary | ICD-10-CM | POA: Diagnosis not present

## 2021-12-09 DIAGNOSIS — M25512 Pain in left shoulder: Secondary | ICD-10-CM | POA: Diagnosis not present

## 2021-12-09 DIAGNOSIS — M25612 Stiffness of left shoulder, not elsewhere classified: Secondary | ICD-10-CM | POA: Diagnosis not present

## 2021-12-16 DIAGNOSIS — M25512 Pain in left shoulder: Secondary | ICD-10-CM | POA: Diagnosis not present

## 2021-12-16 DIAGNOSIS — M25612 Stiffness of left shoulder, not elsewhere classified: Secondary | ICD-10-CM | POA: Diagnosis not present

## 2021-12-16 DIAGNOSIS — M6281 Muscle weakness (generalized): Secondary | ICD-10-CM | POA: Diagnosis not present

## 2021-12-23 DIAGNOSIS — M25512 Pain in left shoulder: Secondary | ICD-10-CM | POA: Diagnosis not present

## 2021-12-23 DIAGNOSIS — M25612 Stiffness of left shoulder, not elsewhere classified: Secondary | ICD-10-CM | POA: Diagnosis not present

## 2021-12-23 DIAGNOSIS — M6281 Muscle weakness (generalized): Secondary | ICD-10-CM | POA: Diagnosis not present

## 2021-12-30 DIAGNOSIS — M6281 Muscle weakness (generalized): Secondary | ICD-10-CM | POA: Diagnosis not present

## 2021-12-30 DIAGNOSIS — M25512 Pain in left shoulder: Secondary | ICD-10-CM | POA: Diagnosis not present

## 2021-12-30 DIAGNOSIS — M25612 Stiffness of left shoulder, not elsewhere classified: Secondary | ICD-10-CM | POA: Diagnosis not present

## 2022-01-04 DIAGNOSIS — M25612 Stiffness of left shoulder, not elsewhere classified: Secondary | ICD-10-CM | POA: Diagnosis not present

## 2022-01-04 DIAGNOSIS — M25512 Pain in left shoulder: Secondary | ICD-10-CM | POA: Diagnosis not present

## 2022-01-04 DIAGNOSIS — M6281 Muscle weakness (generalized): Secondary | ICD-10-CM | POA: Diagnosis not present

## 2022-01-06 DIAGNOSIS — M25512 Pain in left shoulder: Secondary | ICD-10-CM | POA: Diagnosis not present

## 2022-01-06 DIAGNOSIS — M6281 Muscle weakness (generalized): Secondary | ICD-10-CM | POA: Diagnosis not present

## 2022-01-06 DIAGNOSIS — M25612 Stiffness of left shoulder, not elsewhere classified: Secondary | ICD-10-CM | POA: Diagnosis not present

## 2022-01-20 DIAGNOSIS — Z1152 Encounter for screening for COVID-19: Secondary | ICD-10-CM | POA: Diagnosis not present

## 2022-01-20 DIAGNOSIS — B349 Viral infection, unspecified: Secondary | ICD-10-CM | POA: Diagnosis not present

## 2022-01-22 DIAGNOSIS — M6281 Muscle weakness (generalized): Secondary | ICD-10-CM | POA: Diagnosis not present

## 2022-01-22 DIAGNOSIS — M25512 Pain in left shoulder: Secondary | ICD-10-CM | POA: Diagnosis not present

## 2022-01-22 DIAGNOSIS — M25612 Stiffness of left shoulder, not elsewhere classified: Secondary | ICD-10-CM | POA: Diagnosis not present

## 2022-01-25 DIAGNOSIS — M25512 Pain in left shoulder: Secondary | ICD-10-CM | POA: Diagnosis not present

## 2022-01-25 DIAGNOSIS — M25612 Stiffness of left shoulder, not elsewhere classified: Secondary | ICD-10-CM | POA: Diagnosis not present

## 2022-01-25 DIAGNOSIS — M6281 Muscle weakness (generalized): Secondary | ICD-10-CM | POA: Diagnosis not present

## 2022-01-27 DIAGNOSIS — M25612 Stiffness of left shoulder, not elsewhere classified: Secondary | ICD-10-CM | POA: Diagnosis not present

## 2022-01-27 DIAGNOSIS — M6281 Muscle weakness (generalized): Secondary | ICD-10-CM | POA: Diagnosis not present

## 2022-01-27 DIAGNOSIS — M25512 Pain in left shoulder: Secondary | ICD-10-CM | POA: Diagnosis not present

## 2022-02-01 DIAGNOSIS — M25612 Stiffness of left shoulder, not elsewhere classified: Secondary | ICD-10-CM | POA: Diagnosis not present

## 2022-02-01 DIAGNOSIS — M6281 Muscle weakness (generalized): Secondary | ICD-10-CM | POA: Diagnosis not present

## 2022-02-01 DIAGNOSIS — M25512 Pain in left shoulder: Secondary | ICD-10-CM | POA: Diagnosis not present

## 2022-02-03 DIAGNOSIS — M25512 Pain in left shoulder: Secondary | ICD-10-CM | POA: Diagnosis not present

## 2022-02-03 DIAGNOSIS — M25612 Stiffness of left shoulder, not elsewhere classified: Secondary | ICD-10-CM | POA: Diagnosis not present

## 2022-02-03 DIAGNOSIS — M6281 Muscle weakness (generalized): Secondary | ICD-10-CM | POA: Diagnosis not present

## 2022-02-10 DIAGNOSIS — M25612 Stiffness of left shoulder, not elsewhere classified: Secondary | ICD-10-CM | POA: Diagnosis not present

## 2022-02-10 DIAGNOSIS — M6281 Muscle weakness (generalized): Secondary | ICD-10-CM | POA: Diagnosis not present

## 2022-02-10 DIAGNOSIS — M25512 Pain in left shoulder: Secondary | ICD-10-CM | POA: Diagnosis not present

## 2022-02-12 DIAGNOSIS — M25512 Pain in left shoulder: Secondary | ICD-10-CM | POA: Diagnosis not present

## 2022-02-12 DIAGNOSIS — M25612 Stiffness of left shoulder, not elsewhere classified: Secondary | ICD-10-CM | POA: Diagnosis not present

## 2022-02-12 DIAGNOSIS — M6281 Muscle weakness (generalized): Secondary | ICD-10-CM | POA: Diagnosis not present

## 2022-02-24 DIAGNOSIS — M25512 Pain in left shoulder: Secondary | ICD-10-CM | POA: Diagnosis not present

## 2022-02-24 DIAGNOSIS — M6281 Muscle weakness (generalized): Secondary | ICD-10-CM | POA: Diagnosis not present

## 2022-02-24 DIAGNOSIS — M25612 Stiffness of left shoulder, not elsewhere classified: Secondary | ICD-10-CM | POA: Diagnosis not present

## 2022-03-01 DIAGNOSIS — M25512 Pain in left shoulder: Secondary | ICD-10-CM | POA: Diagnosis not present

## 2022-03-01 DIAGNOSIS — M6281 Muscle weakness (generalized): Secondary | ICD-10-CM | POA: Diagnosis not present

## 2022-03-01 DIAGNOSIS — M25612 Stiffness of left shoulder, not elsewhere classified: Secondary | ICD-10-CM | POA: Diagnosis not present

## 2022-03-03 DIAGNOSIS — M6281 Muscle weakness (generalized): Secondary | ICD-10-CM | POA: Diagnosis not present

## 2022-03-03 DIAGNOSIS — M25512 Pain in left shoulder: Secondary | ICD-10-CM | POA: Diagnosis not present

## 2022-03-03 DIAGNOSIS — M25612 Stiffness of left shoulder, not elsewhere classified: Secondary | ICD-10-CM | POA: Diagnosis not present

## 2022-03-10 DIAGNOSIS — M25512 Pain in left shoulder: Secondary | ICD-10-CM | POA: Diagnosis not present

## 2022-03-10 DIAGNOSIS — M25612 Stiffness of left shoulder, not elsewhere classified: Secondary | ICD-10-CM | POA: Diagnosis not present

## 2022-03-10 DIAGNOSIS — M6281 Muscle weakness (generalized): Secondary | ICD-10-CM | POA: Diagnosis not present

## 2022-04-02 DIAGNOSIS — M6281 Muscle weakness (generalized): Secondary | ICD-10-CM | POA: Diagnosis not present

## 2022-04-02 DIAGNOSIS — M25512 Pain in left shoulder: Secondary | ICD-10-CM | POA: Diagnosis not present

## 2022-04-02 DIAGNOSIS — M25612 Stiffness of left shoulder, not elsewhere classified: Secondary | ICD-10-CM | POA: Diagnosis not present

## 2022-04-09 DIAGNOSIS — M25512 Pain in left shoulder: Secondary | ICD-10-CM | POA: Diagnosis not present

## 2022-04-09 DIAGNOSIS — M25612 Stiffness of left shoulder, not elsewhere classified: Secondary | ICD-10-CM | POA: Diagnosis not present

## 2022-04-09 DIAGNOSIS — M6281 Muscle weakness (generalized): Secondary | ICD-10-CM | POA: Diagnosis not present

## 2022-04-14 DIAGNOSIS — M6281 Muscle weakness (generalized): Secondary | ICD-10-CM | POA: Diagnosis not present

## 2022-04-14 DIAGNOSIS — M25612 Stiffness of left shoulder, not elsewhere classified: Secondary | ICD-10-CM | POA: Diagnosis not present

## 2022-04-14 DIAGNOSIS — M25512 Pain in left shoulder: Secondary | ICD-10-CM | POA: Diagnosis not present

## 2022-04-21 DIAGNOSIS — M6281 Muscle weakness (generalized): Secondary | ICD-10-CM | POA: Diagnosis not present

## 2022-04-21 DIAGNOSIS — M25512 Pain in left shoulder: Secondary | ICD-10-CM | POA: Diagnosis not present

## 2022-04-21 DIAGNOSIS — M25612 Stiffness of left shoulder, not elsewhere classified: Secondary | ICD-10-CM | POA: Diagnosis not present

## 2022-04-22 ENCOUNTER — Telehealth: Payer: Self-pay | Admitting: Internal Medicine

## 2022-04-22 NOTE — Telephone Encounter (Signed)
Wymon Swaney [phone]  Denzal wants to come in the week of 05/10/22 to 05/15/22 to discuss hair loss and get LDL totals, when he was in last may for CPE we were unable to do because he had a seizure just right before. Do you want me to work him in for a CPE and labs or just do an appointment? We already had 3 - CPE each day that week.

## 2022-05-05 NOTE — Progress Notes (Addendum)
Patient Care Team: Elby Showers, MD as PCP - General (Internal Medicine)  Visit Date: 05/10/22  Subjective:    Patient ID: Gary Hamilton , Male   DOB: [birth year], 27 y.o.    MRN: [id number]   27 y.o. Male presents today for hair loss and cholesterol check. Patient has a past medical history of febrile seizures, seasonal allergies.  Reports recent hair loss. Reports his hair line has been receding for past 2 years. His father has hair loss.   History of febrile seizure on 08/15/21. 08/27/21 brain MRI with and without contrast was unremarkable with no acute intracranial pathology or epileptogenic focus. Followed by Neurologist, Alric Ran.  LDL elevated at 112 one year ago, decreased to 97 on 08/25/21.  Reports difficulty sleeping during the week prior to his exams. He is interested in trying low-dose Xanax to help with sleeping.   Past Medical History:  Diagnosis Date   Febrile seizure ([place]) as an infant   x 1   Fracture of metacarpal 07/30/2012   right long and ring   Seasonal allergies      Family History  Problem Relation Age of Onset   Hypertension Mother    Colon cancer Neg Hx    Stomach cancer Neg Hx    Pancreatic cancer Neg Hx    Esophageal cancer Neg Hx     Social History   Social History Narrative   Not on file      Review of Systems  Constitutional:  Negative for fever and malaise/fatigue.  HENT:  Negative for congestion.   Eyes:  Negative for blurred vision.  Respiratory:  Negative for cough and shortness of breath.   Cardiovascular:  Negative for chest pain, palpitations and leg swelling.  Gastrointestinal:  Negative for vomiting.  Musculoskeletal:  Negative for back pain.  Skin:  Negative for rash.  Neurological:  Negative for loss of consciousness and headaches.        Objective:   Vitals: BP 102/70   Pulse 60   Temp 98.6 F (37 C) (Tympanic)   Ht 5' 9"$  (1.753 m)   Wt 163 lb 6.4 oz (74.1 kg)   SpO2 99%   BMI 24.13 kg/m     Physical Exam Vitals and nursing note reviewed.  Constitutional:      General: He is not in acute distress.    Appearance: Normal appearance. He is not ill-appearing.  HENT:     Head: Normocephalic and atraumatic.     Comments: Receding hair line on right frontal area. Follicles normal, no inflammation, dandruff, seborrheic dermatitis.  Flat mole 3-4 mm on scalp, appears benign. Neck:     Thyroid: No thyroid mass, thyromegaly or thyroid tenderness.     Vascular: No carotid bruit.  Pulmonary:     Effort: Pulmonary effort is normal.  Skin:    General: Skin is warm and dry.  Neurological:     Mental Status: He is alert and oriented to person, place, and time. Mental status is at baseline.  Psychiatric:        Mood and Affect: Mood normal.        Behavior: Behavior normal.        Thought Content: Thought content normal.        Judgment: Judgment normal.       Results:   Studies obtained and personally reviewed by me:    Labs:       Component Value Date/Time   NA 141 08/25/2021 1446  K 4.9 08/25/2021 1446   CL 107 08/25/2021 1446   CO2 24 08/25/2021 1446   GLUCOSE 100 (H) 08/25/2021 1446   BUN 17 08/25/2021 1446   CREATININE 0.95 08/25/2021 1446   CALCIUM 9.5 08/25/2021 1446   PROT 6.4 08/25/2021 1446   AST 17 08/25/2021 1446   ALT 17 08/25/2021 1446   BILITOT 0.5 08/25/2021 1446   GFRNONAA 90 01/04/2020 0954   GFRAA 104 01/04/2020 0954     Lab Results  Component Value Date   WBC 6.0 01/04/2020   HGB 14.5 01/04/2020   HCT 43.2 01/04/2020   MCV 91.7 01/04/2020   PLT 270 01/04/2020    Lab Results  Component Value Date   CHOL 172 08/25/2021   HDL 59 08/25/2021   LDLCALC 97 08/25/2021   TRIG 70 08/25/2021   CHOLHDL 2.9 08/25/2021    No results found for: "HGBA1C"   Lab Results  Component Value Date   TSH 1.20 08/25/2021      Assessment & Plan:   Hair Loss: Ordered lab work with CBC with Diff/Plat, Iron/TIBC/Ferritin panel, T4,  Testosterone, TSH. Consider referring to dermatologist after tests completed.   Sleep Difficulty Before Test: Prescribed Xanax 0.25-0.5 mg as needed for sleep.  Febrile Seizure: Had a seizure on 08/15/21. 08/27/21 brain MRI with and without contrast was unremarkable with no acute intracranial pathology or epileptogenic focus. Followed by Neurologist, Alric Ran.  Mild Elevation of LDL: LDL elevated at 112 one year ago, decreased to 97 on 08/25/21. Lipid panel ordered. Will watch diet.    I,Alexander Ruley,acting as a Education administrator for Elby Showers, MD.,have documented all relevant documentation on the behalf of Elby Showers, MD,as directed by  Elby Showers, MD while in the presence of Elby Showers, MD.    I, Elby Showers, MD, have reviewed all documentation for this visit. The documentation on 05/10/22 for the exam, diagnosis, procedures, and orders are all accurate and complete.

## 2022-05-10 ENCOUNTER — Ambulatory Visit: Payer: BC Managed Care – PPO | Admitting: Internal Medicine

## 2022-05-10 ENCOUNTER — Encounter: Payer: Self-pay | Admitting: Internal Medicine

## 2022-05-10 VITALS — BP 102/70 | HR 60 | Temp 98.6°F | Ht 69.0 in | Wt 163.4 lb

## 2022-05-10 DIAGNOSIS — Z1329 Encounter for screening for other suspected endocrine disorder: Secondary | ICD-10-CM | POA: Diagnosis not present

## 2022-05-10 DIAGNOSIS — L659 Nonscarring hair loss, unspecified: Secondary | ICD-10-CM | POA: Diagnosis not present

## 2022-05-10 DIAGNOSIS — F409 Phobic anxiety disorder, unspecified: Secondary | ICD-10-CM | POA: Diagnosis not present

## 2022-05-10 DIAGNOSIS — F5105 Insomnia due to other mental disorder: Secondary | ICD-10-CM | POA: Diagnosis not present

## 2022-05-10 DIAGNOSIS — E7800 Pure hypercholesterolemia, unspecified: Secondary | ICD-10-CM | POA: Diagnosis not present

## 2022-05-10 DIAGNOSIS — G40909 Epilepsy, unspecified, not intractable, without status epilepticus: Secondary | ICD-10-CM | POA: Insufficient documentation

## 2022-05-10 MED ORDER — ALPRAZOLAM 0.5 MG PO TABS: 0.5000 mg | ORAL_TABLET | Freq: Every evening | ORAL | 0 refills | Status: DC | PRN

## 2022-05-10 NOTE — Patient Instructions (Signed)
Xanax prescribed to take sparing for anxiety and insomnia associated with exam taking.  Labs drawn to assess hair loss but think he likely has inherited this from his father. May need to see dermatologist about hair loss treatment pending results of labs.

## 2022-05-10 NOTE — Progress Notes (Signed)
Patient Care Team: Elby Showers, MD as PCP - General (Internal Medicine)  Visit Date: 05/10/22  Subjective:    Patient ID: Gary Hamilton , Male   DOB: 1995-07-25, 27 y.o.    MRN: [id number]   27 y.o. Male presents today for hair loss and cholesterol check. Patient has a past medical history of febrile seizures, seasonal allergies.   Reports recent hair loss. Reports his hair line has been receding for past 2 years. His father has hair loss.    History of febrile seizure on 08/15/21. 08/27/21 brain MRI with and without contrast was unremarkable with no acute intracranial pathology or epileptogenic focus. Followed by Neurologist, Alric Ran.   LDL elevated at 112 one year ago, decreased to 97 on 08/25/21.   Reports difficulty sleeping during the week prior to his exams. He is interested in trying low-dose Xanax to help with sleeping.   Past Medical History:  Diagnosis Date   Febrile seizure ([place]) as an infant   x 1   Fracture of metacarpal 07/30/2012   right long and ring   Seasonal allergies      Family History  Problem Relation Age of Onset   Hypertension Mother    Colon cancer Neg Hx    Stomach cancer Neg Hx    Pancreatic cancer Neg Hx    Esophageal cancer Neg Hx     Social History   Social History Narrative   Not on file      Review of Systems  Constitutional:  Negative for fever and malaise/fatigue.  HENT:  Negative for congestion.   Eyes:  Negative for blurred vision.  Respiratory:  Negative for cough and shortness of breath.   Cardiovascular:  Negative for chest pain, palpitations and leg swelling.  Gastrointestinal:  Negative for vomiting.  Musculoskeletal:  Negative for back pain.  Skin:  Negative for rash.  Neurological:  Negative for loss of consciousness and headaches.        Objective:   Vitals: BP 102/70   Pulse 60   Temp 98.6 F (37 C) (Tympanic)   Ht 5' 9"$  (1.753 m)   Wt 163 lb 6.4 oz (74.1 kg)   SpO2 99%   BMI 24.13 kg/m     Physical Exam Vitals and nursing note reviewed.  Constitutional:      General: He is not in acute distress.    Appearance: Normal appearance. He is not ill-appearing.  HENT:     Head: Normocephalic and atraumatic.     Comments: Receding hair line on right frontal area. Follicles normal, no inflammation, dandruff, seborrheic dermatitis.  Flat mole 3-4 mm on scalp, appears benign. Neck:     Thyroid: No thyroid mass, thyromegaly or thyroid tenderness.     Vascular: No carotid bruit.  Pulmonary:     Effort: Pulmonary effort is normal.  Lymphadenopathy:     Cervical: No cervical adenopathy.  Skin:    General: Skin is warm and dry.  Neurological:     Mental Status: He is alert and oriented to person, place, and time. Mental status is at baseline.  Psychiatric:        Mood and Affect: Mood normal.        Behavior: Behavior normal.        Thought Content: Thought content normal.        Judgment: Judgment normal.       Results:   Studies obtained and personally reviewed by me:   Labs:  Component Value Date/Time   NA 141 08/25/2021 1446   K 4.9 08/25/2021 1446   CL 107 08/25/2021 1446   CO2 24 08/25/2021 1446   GLUCOSE 100 (H) 08/25/2021 1446   BUN 17 08/25/2021 1446   CREATININE 0.95 08/25/2021 1446   CALCIUM 9.5 08/25/2021 1446   PROT 6.4 08/25/2021 1446   AST 17 08/25/2021 1446   ALT 17 08/25/2021 1446   BILITOT 0.5 08/25/2021 1446   GFRNONAA 90 01/04/2020 0954   GFRAA 104 01/04/2020 0954     Lab Results  Component Value Date   WBC 6.0 01/04/2020   HGB 14.5 01/04/2020   HCT 43.2 01/04/2020   MCV 91.7 01/04/2020   PLT 270 01/04/2020    Lab Results  Component Value Date   CHOL 172 08/25/2021   HDL 59 08/25/2021   LDLCALC 97 08/25/2021   TRIG 70 08/25/2021   CHOLHDL 2.9 08/25/2021    No results found for: "HGBA1C"   Lab Results  Component Value Date   TSH 1.20 08/25/2021      Assessment & Plan:   Hair Loss: Ordered lab work with CBC  with Diff/Plat, Iron/TIBC/Ferritin panel, T4, Testosterone, TSH. Consider referring to dermatologist after tests completed.    Sleep Difficulty Before Test: Prescribed Xanax 0.25-0.5 mg as needed for sleep.   Febrile Seizure: Had a seizure on 08/15/21. 08/27/21 brain MRI with and without contrast was unremarkable with no acute intracranial pathology or epileptogenic focus. Followed by Neurologist, Alric Ran.   Mild Elevation of LDL: LDL elevated at 112 one year ago, decreased to 97 on 08/25/21. Lipid panel ordered. Will watch diet.   IElby Showers, MD, have reviewed all documentation for this visit. The documentation on 05/11/22 for the exam, diagnosis, procedures, and orders are all accurate and complete.   I,Alexander Ruley,acting as a Education administrator for Elby Showers, MD.,have documented all relevant documentation on the behalf of Elby Showers, MD,as directed by  Elby Showers, MD while in the presence of Elby Showers, MD.      I, Elby Showers, MD, have reviewed all documentation for this visit. The documentation on 05/10/22 for the exam, diagnosis, procedures, and orders are all accurate and complete.

## 2022-05-11 LAB — TESTOSTERONE: Testosterone: 503 ng/dL (ref 250–827)

## 2022-05-11 LAB — CBC WITH DIFFERENTIAL/PLATELET
Absolute Monocytes: 651 cells/uL (ref 200–950)
Basophils Absolute: 26 cells/uL (ref 0–200)
Basophils Relative: 0.3 %
Eosinophils Absolute: 106 cells/uL (ref 15–500)
Eosinophils Relative: 1.2 %
HCT: 37.3 % — ABNORMAL LOW (ref 38.5–50.0)
Hemoglobin: 12.7 g/dL — ABNORMAL LOW (ref 13.2–17.1)
Lymphs Abs: 2244 cells/uL (ref 850–3900)
MCH: 30.2 pg (ref 27.0–33.0)
MCHC: 34 g/dL (ref 32.0–36.0)
MCV: 88.8 fL (ref 80.0–100.0)
MPV: 10.7 fL (ref 7.5–12.5)
Monocytes Relative: 7.4 %
Neutro Abs: 5773 cells/uL (ref 1500–7800)
Neutrophils Relative %: 65.6 %
Platelets: 208 10*3/uL (ref 140–400)
RBC: 4.2 10*6/uL (ref 4.20–5.80)
RDW: 11.8 % (ref 11.0–15.0)
Total Lymphocyte: 25.5 %
WBC: 8.8 10*3/uL (ref 3.8–10.8)

## 2022-05-11 LAB — LIPID PANEL
Cholesterol: 162 mg/dL (ref <200)
HDL: 61 mg/dL (ref >=40)
LDL Cholesterol (Calc): 81 mg/dL (calc)
Non-HDL Cholesterol (Calc): 101 mg/dL (calc) (ref <130)
Total CHOL/HDL Ratio: 2.7 (calc) (ref <5.0)
Triglycerides: 108 mg/dL (ref <150)

## 2022-05-11 LAB — IRON,TIBC AND FERRITIN PANEL
%SAT: 24 % (calc) (ref 20–48)
Ferritin: 79 ng/mL (ref 38–380)
Iron: 70 ug/dL (ref 50–195)
TIBC: 286 mcg/dL (calc) (ref 250–425)

## 2022-05-11 LAB — T4, FREE: Free T4: 1.2 ng/dL (ref 0.8–1.8)

## 2022-05-11 LAB — TSH: TSH: 2.16 mIU/L (ref 0.40–4.50)

## 2022-05-17 ENCOUNTER — Telehealth: Payer: Self-pay

## 2022-05-17 NOTE — Telephone Encounter (Signed)
My Chart message sent

## 2022-05-17 NOTE — Telephone Encounter (Signed)
Results have been relayed to the patient. The patient verbalized understanding. No questions at this time.  Patient would like a referral to Dermatology

## 2022-06-29 DIAGNOSIS — Z111 Encounter for screening for respiratory tuberculosis: Secondary | ICD-10-CM | POA: Diagnosis not present

## 2022-07-22 DIAGNOSIS — R0789 Other chest pain: Secondary | ICD-10-CM | POA: Diagnosis not present

## 2022-10-13 DIAGNOSIS — Z23 Encounter for immunization: Secondary | ICD-10-CM | POA: Diagnosis not present

## 2023-01-14 ENCOUNTER — Telehealth: Payer: Self-pay | Admitting: Internal Medicine

## 2023-01-14 NOTE — Telephone Encounter (Signed)
LVM to CB to schedule an appointment. He had sent a Mychart message wanting an appointment for 02/21/23 thru 02/23/2023. The only thing we can do is for him to come in on 02/21/23 to get fasting labs and come back on 02/22/23 at 2:00 for CPE.  Below is part of the message from [place] of the labs he was wanting.  Lab work - CBC, CMP, lipid panel, maybe HCV/HIV. EKG - had PACs (?PVCs) last visit Refill xanax

## 2023-02-15 NOTE — Progress Notes (Signed)
Patient Care Team: Margaree Mackintosh, MD as PCP - General (Internal Medicine)  Visit Date: 02/22/23  Subjective:    Patient ID: Gary Hamilton , Male   DOB: 09-11-1995, 27 y.o.    MRN: [id number]   27 y.o. Male presents today for annual comprehensive physical exam. Remote history of febrile seizure at age 59,  hx of metacarpal fracture 2014, seasonal allergies, anxiety.  History of anxiety treated with alprazolam 0.5 mg at bedtime as needed. Requesting refill. 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  History of elevated cholesterol. CHOL elevated at 202, LDL elevated at 115. He has noticed some weight gain recently that he believes is due to poor diet.  He has had a cough with green sputum that is worse in the morning since 02/15/23.  History of febrile seizure at the age of 42 and has otherwise been doing fine. Had one seizure on Aug 15 2021 and per girlfriend's report had a generalized convulsion with foaming at the mouth, eyes rolled back, no tongue biting, urinary incontinence. Denies family history of seizures. Followed by neurologist, Dr. Windell Norfolk. 08/27/21 MRI brain unremarkable with no acute intracranial pathology or epileptogenic focus.   Denies GI problems, leg swelling.  02/21/23 labs reviewed today. Glucose elevated at 101. Kidney, liver functions normal. Electrolytes normal. Blood proteins normal. CBC normal.  Past Medical History:  Diagnosis Date   Febrile seizure (HCC) as an infant   x 1   Fracture of metacarpal 07/30/2012   right long and ring   Seasonal allergies      Family History  Problem Relation Age of Onset   Hypertension Mother    Colon cancer Neg Hx    Stomach cancer Neg Hx    Pancreatic cancer Neg Hx    Esophageal cancer Neg Hx     Social Hx: Psychologist, occupational at [employer]. Does well in school. Has an interest in ENT research.     Review of Systems  Constitutional:  Negative for  chills, fever, malaise/fatigue and weight loss.  HENT:  Negative for hearing loss, sinus pain and sore throat.   Respiratory:  Positive for cough and sputum production (Green). Negative for hemoptysis and shortness of breath.   Cardiovascular:  Negative for chest pain, palpitations, leg swelling and PND.  Gastrointestinal:  Negative for abdominal pain, constipation, diarrhea, heartburn, nausea and vomiting.  Genitourinary:  Negative for dysuria, frequency and urgency.  Musculoskeletal:  Negative for back pain, myalgias and neck pain.  Skin:  Negative for itching and rash.  Neurological:  Negative for dizziness, tingling, seizures and headaches.  Endo/Heme/Allergies:  Negative for polydipsia.  Psychiatric/Behavioral:  Negative for depression. The patient is not nervous/anxious.         Objective:   Vitals: BP 110/60   Pulse 94   Ht 5\' 9"  (1.753 m)   Wt 158 lb (71.7 kg)   SpO2 99%   BMI 23.33 kg/m    Physical Exam Vitals and nursing note reviewed. Exam conducted with a chaperone present.  Constitutional:      General: He is awake. He is not in acute distress.    Appearance: Normal appearance. He is not ill-appearing or toxic-appearing.  HENT:     Head: Normocephalic and atraumatic.     Right Ear: Hearing, tympanic membrane, ear canal and external ear normal.     Left Ear: Hearing, tympanic membrane, ear canal and external ear normal.  Mouth/Throat:     Pharynx: Oropharynx is clear.  Eyes:     Extraocular Movements: Extraocular movements intact.     Pupils: Pupils are equal, round, and reactive to light.  Neck:     Thyroid: No thyroid mass, thyromegaly or thyroid tenderness.     Vascular: No carotid bruit.  Cardiovascular:     Rate and Rhythm: Normal rate and regular rhythm. No extrasystoles are present.    Heart sounds: Normal heart sounds. No murmur heard.    No friction rub. No gallop.  Pulmonary:     Effort: Pulmonary effort is normal.     Breath sounds: Normal  breath sounds. No decreased breath sounds, wheezing, rhonchi or rales.  Chest:     Chest wall: No mass.  Abdominal:     Palpations: Abdomen is soft. There is no hepatomegaly, splenomegaly or mass.     Tenderness: There is no abdominal tenderness.     Hernia: No hernia is present. There is no hernia in the left inguinal area or right inguinal area.  Musculoskeletal:     Cervical back: Normal range of motion.     Right lower leg: No edema.     Left lower leg: No edema.  Lymphadenopathy:     Cervical: No cervical adenopathy.     Upper Body:     Right upper body: No supraclavicular adenopathy.     Left upper body: No supraclavicular adenopathy.  Skin:    General: Skin is warm and dry.  Neurological:     General: No focal deficit present.     Mental Status: He is alert and oriented to person, place, and time. Mental status is at baseline.     Cranial Nerves: Cranial nerves 2-12 are intact.     Sensory: Sensation is intact.     Motor: Motor function is intact.     Coordination: Coordination is intact.     Gait: Gait is intact.     Deep Tendon Reflexes: Reflexes are normal and symmetric.  Psychiatric:        Attention and Perception: Attention normal.        Mood and Affect: Mood normal.        Speech: Speech normal.        Behavior: Behavior normal. Behavior is cooperative.        Thought Content: Thought content normal.        Cognition and Memory: Cognition and memory normal.        Judgment: Judgment normal.       Results:   Studies obtained and personally reviewed by me:  08/27/21 MRI brain unremarkable with no acute intracranial pathology or epileptogenic focus.   Labs:       Component Value Date/Time   NA 139 02/21/2023 1056   K 5.1 02/21/2023 1056   CL 100 02/21/2023 1056   CO2 29 02/21/2023 1056   GLUCOSE 101 (H) 02/21/2023 1056   BUN 17 02/21/2023 1056   CREATININE 0.94 02/21/2023 1056   CALCIUM 10.3 02/21/2023 1056   PROT 7.5 02/21/2023 1056   AST 23  02/21/2023 1056   ALT 24 02/21/2023 1056   BILITOT 0.5 02/21/2023 1056   GFRNONAA 90 01/04/2020 0954   GFRAA 104 01/04/2020 0954     Lab Results  Component Value Date   WBC 8.2 02/21/2023   HGB 14.2 02/21/2023   HCT 42.9 02/21/2023   MCV 90.7 02/21/2023   PLT 288 02/21/2023    Lab Results  Component Value  Date   CHOL 202 (H) 02/21/2023   HDL 73 02/21/2023   LDLCALC 115 (H) 02/21/2023   TRIG 46 02/21/2023   CHOLHDL 2.8 02/21/2023    No results found for: "HGBA1C"   Lab Results  Component Value Date   TSH 2.16 05/10/2022      Assessment & Plan:   Acute upper respiratory infection: prescribed Z-Pak two tabs day 1 followed by one tab days 2-5, Tessalon 100 mg three times daily as needed. Contact us if symptoms worsen or do not improve.  Anxiety: stable with alprazolam 0.5 mg at bedtime as needed. Refilled.  Elevated cholesterol: he will work on controlling with healthy diet and exercise. CHOL elevated at 202, LDL elevated at 115.   Vaccine counseling: UTD on tetanus, flu boosters.    I,Alexander Ruley,acting as a Neurosurgeon for Margaree Mackintosh, MD.,have documented all relevant documentation on the behalf of Margaree Mackintosh, MD,as directed by  Margaree Mackintosh, MD while in the presence of Margaree Mackintosh, MD.   I, Margaree Mackintosh, MD, have reviewed all documentation for this visit. The documentation on 02/26/23 for the exam, diagnosis, procedures, and orders are all accurate and complete.

## 2023-02-21 ENCOUNTER — Other Ambulatory Visit: Payer: BC Managed Care – PPO

## 2023-02-21 DIAGNOSIS — G40909 Epilepsy, unspecified, not intractable, without status epilepticus: Secondary | ICD-10-CM

## 2023-02-21 DIAGNOSIS — Z0000 Encounter for general adult medical examination without abnormal findings: Secondary | ICD-10-CM

## 2023-02-21 DIAGNOSIS — Z13220 Encounter for screening for lipoid disorders: Secondary | ICD-10-CM

## 2023-02-21 DIAGNOSIS — E7800 Pure hypercholesterolemia, unspecified: Secondary | ICD-10-CM | POA: Diagnosis not present

## 2023-02-22 ENCOUNTER — Encounter: Payer: Self-pay | Admitting: Internal Medicine

## 2023-02-22 ENCOUNTER — Other Ambulatory Visit: Payer: Self-pay | Admitting: Internal Medicine

## 2023-02-22 ENCOUNTER — Ambulatory Visit (INDEPENDENT_AMBULATORY_CARE_PROVIDER_SITE_OTHER): Payer: BC Managed Care – PPO | Admitting: Internal Medicine

## 2023-02-22 VITALS — BP 110/60 | HR 94 | Ht 69.0 in | Wt 158.0 lb

## 2023-02-22 DIAGNOSIS — F409 Phobic anxiety disorder, unspecified: Secondary | ICD-10-CM

## 2023-02-22 DIAGNOSIS — F5105 Insomnia due to other mental disorder: Secondary | ICD-10-CM

## 2023-02-22 DIAGNOSIS — J069 Acute upper respiratory infection, unspecified: Secondary | ICD-10-CM | POA: Diagnosis not present

## 2023-02-22 DIAGNOSIS — E7800 Pure hypercholesterolemia, unspecified: Secondary | ICD-10-CM

## 2023-02-22 DIAGNOSIS — Z0000 Encounter for general adult medical examination without abnormal findings: Secondary | ICD-10-CM | POA: Diagnosis not present

## 2023-02-22 LAB — LIPID PANEL
Cholesterol: 202 mg/dL — ABNORMAL HIGH (ref <200)
HDL: 73 mg/dL (ref >=40)
LDL Cholesterol (Calc): 115 mg/dL — ABNORMAL HIGH
Non-HDL Cholesterol (Calc): 129 mg/dL (ref <130)
Total CHOL/HDL Ratio: 2.8 (calc) (ref <5.0)
Triglycerides: 46 mg/dL (ref <150)

## 2023-02-22 LAB — COMPLETE METABOLIC PANEL WITH GFR
AG Ratio: 1.9 (calc) (ref 1.0–2.5)
ALT: 24 U/L (ref 9–46)
AST: 23 U/L (ref 10–40)
Albumin: 4.9 g/dL (ref 3.6–5.1)
Alkaline phosphatase (APISO): 81 U/L (ref 36–130)
BUN: 17 mg/dL (ref 7–25)
CO2: 29 mmol/L (ref 20–32)
Calcium: 10.3 mg/dL (ref 8.6–10.3)
Chloride: 100 mmol/L (ref 98–110)
Creat: 0.94 mg/dL (ref 0.60–1.24)
Globulin: 2.6 g/dL (ref 1.9–3.7)
Glucose, Bld: 101 mg/dL — ABNORMAL HIGH (ref 65–99)
Potassium: 5.1 mmol/L (ref 3.5–5.3)
Sodium: 139 mmol/L (ref 135–146)
Total Bilirubin: 0.5 mg/dL (ref 0.2–1.2)
Total Protein: 7.5 g/dL (ref 6.1–8.1)
eGFR: 114 mL/min/{1.73_m2} (ref >=60)

## 2023-02-22 LAB — CBC WITH DIFFERENTIAL/PLATELET
Absolute Lymphocytes: 1525 {cells}/uL (ref 850–3900)
Absolute Monocytes: 525 {cells}/uL (ref 200–950)
Basophils Absolute: 41 {cells}/uL (ref 0–200)
Basophils Relative: 0.5 %
Eosinophils Absolute: 74 {cells}/uL (ref 15–500)
Eosinophils Relative: 0.9 %
HCT: 42.9 % (ref 38.5–50.0)
Hemoglobin: 14.2 g/dL (ref 13.2–17.1)
MCH: 30 pg (ref 27.0–33.0)
MCHC: 33.1 g/dL (ref 32.0–36.0)
MCV: 90.7 fL (ref 80.0–100.0)
MPV: 10.4 fL (ref 7.5–12.5)
Monocytes Relative: 6.4 %
Neutro Abs: 6035 {cells}/uL (ref 1500–7800)
Neutrophils Relative %: 73.6 %
Platelets: 288 10*3/uL (ref 140–400)
RBC: 4.73 10*6/uL (ref 4.20–5.80)
RDW: 11.9 % (ref 11.0–15.0)
Total Lymphocyte: 18.6 %
WBC: 8.2 10*3/uL (ref 3.8–10.8)

## 2023-02-22 MED ORDER — AZITHROMYCIN 250 MG PO TABS: ORAL_TABLET | ORAL | 0 refills | Status: AC

## 2023-02-22 MED ORDER — BENZONATATE 100 MG PO CAPS: 100.0000 mg | ORAL_CAPSULE | Freq: Three times a day (TID) | ORAL | 0 refills | Status: AC | PRN

## 2023-02-22 NOTE — Patient Instructions (Addendum)
Take Zithromax Z  2 tabs day 1 followed by one tab days 2-5. Tessalon perles as needed 3 times daily for cough. Have refilled Xanax as requested. Watch diet. LDL is 115. Vaccines reviewed. Return in one year or as needed. It was a pleasure to see you today.

## 2023-02-23 ENCOUNTER — Encounter: Payer: Self-pay | Admitting: Internal Medicine

## 2023-02-23 MED ORDER — ALPRAZOLAM 0.5 MG PO TABS: 0.5000 mg | ORAL_TABLET | Freq: Two times a day (BID) | ORAL | 0 refills | Status: AC | PRN

## 2023-02-23 MED ORDER — ALPRAZOLAM 0.5 MG PO TABS: 0.5000 mg | ORAL_TABLET | Freq: Every evening | ORAL | 0 refills | Status: AC | PRN

## 2023-02-23 MED ORDER — HYDROCODONE BIT-HOMATROP MBR 5-1.5 MG/5ML PO SOLN: 5.0000 mL | Freq: Three times a day (TID) | ORAL | 0 refills | Status: AC | PRN

## 2023-06-28 IMAGING — MR MR HEAD WO/W CM
10 of 12 series · 35 of 48 positions shown · IV contrast (multihance)
Comparison: None Available.

CLINICAL DATA: New onset seizure 2 weeks ago

EXAM:
MRI HEAD WITHOUT AND WITH CONTRAST
TECHNIQUE: Multiplanar, multiecho pulse sequences of the brain and surrounding
structures were obtained without and with intravenous contrast.
CONTRAST:  13mL MULTIHANCE GADOBENATE DIMEGLUMINE 529 MG/ML IV SOLN

[Series 2: T1 · sagittal · 5.0mm · 0.45mm/px · 3 of 23 slices shown]
[im 1/23]
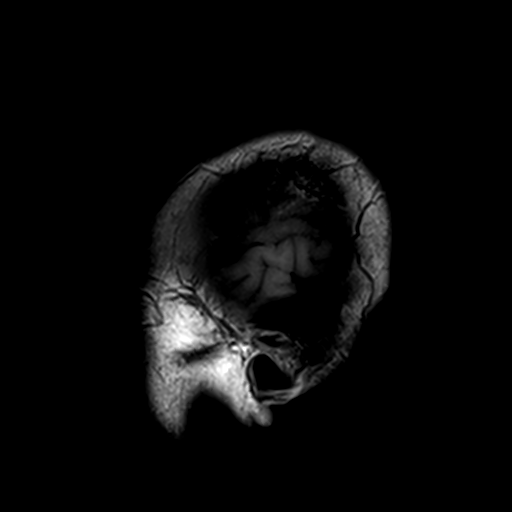
[im 12/23]
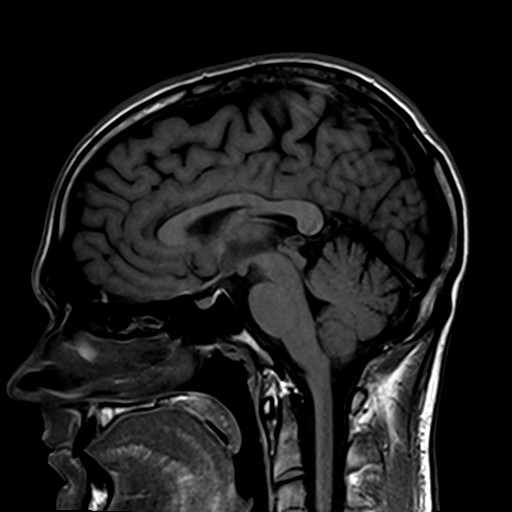
[im 23/23]
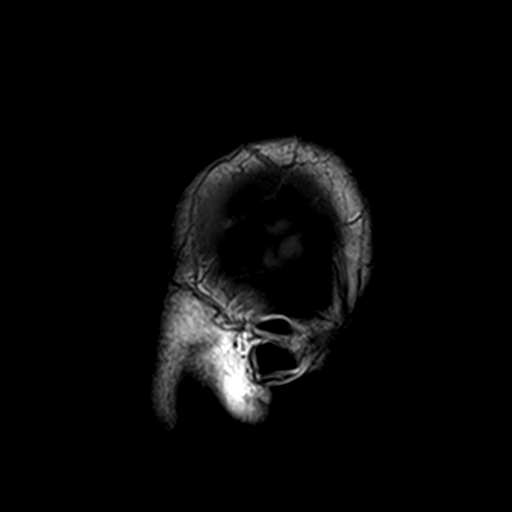

[Series 3: DWI · axial · 3.0mm · 1.80mm/px · z∈[-62,+87]mm · 9 of 102 slices shown (1 of 2)]
[im 1/102]
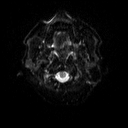
[im 13/102]
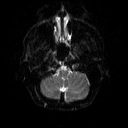
[im 26/102]
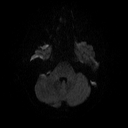
[im 38/102]
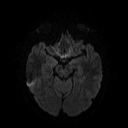
[im 51/102]
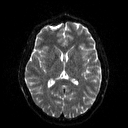
[im 64/102]
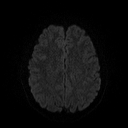
[im 76/102]
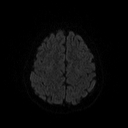
[im 89/102]
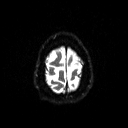
[im 102/102]
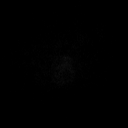

[Series 4: DWI · axial · 3.0mm · 1.80mm/px · z∈[-62,+87]mm · 4 of 49 slices shown (2 of 2)]
[im 1/49]
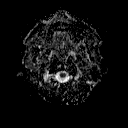
[im 17/49]
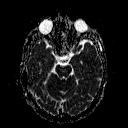
[im 33/49]
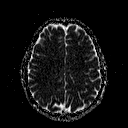
[im 49/49]
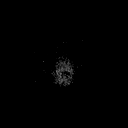

[Series 5: T2 · axial · 5.0mm · 0.51mm/px · z∈[-64,+90]mm · 2 of 24 slices shown (1 of 3)]
[im 1/24]
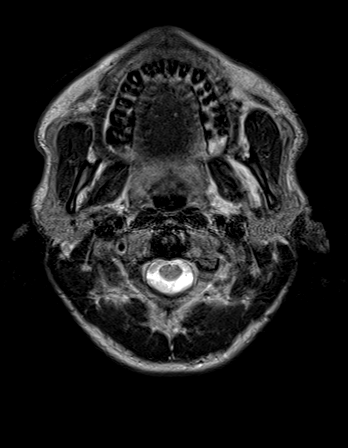
[im 24/24]
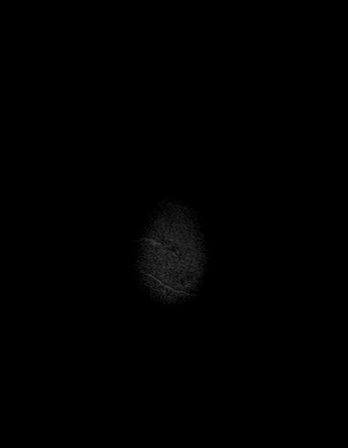

[Series 6: FLAIR · axial · 3.0mm · 0.45mm/px · z∈[-54,+94]mm · 3 of 33 slices shown (1 of 2)]
[im 1/33]
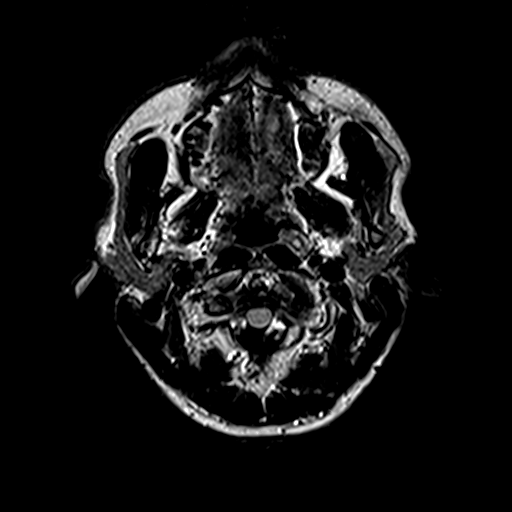
[im 17/33]
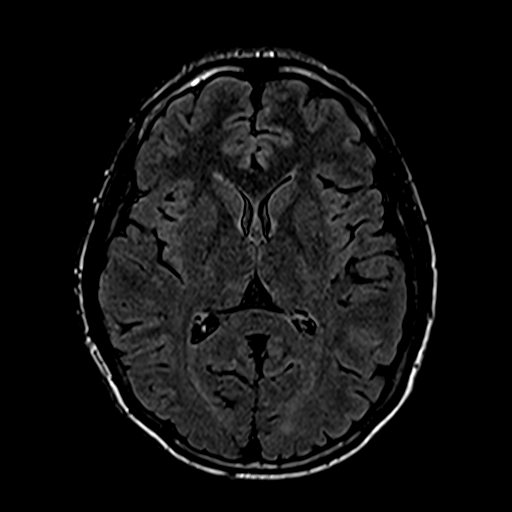
[im 33/33]
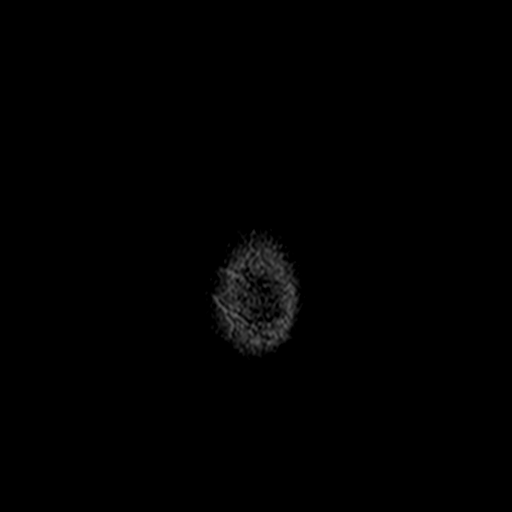

[Series 8: swi_images · axial · 4.0mm · 0.90mm/px · z∈[-44,+96]mm · 3 of 36 slices shown]
[im 1/36]
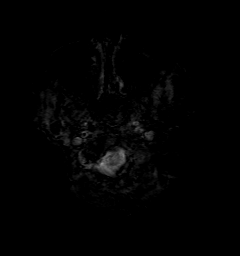
[im 18/36]
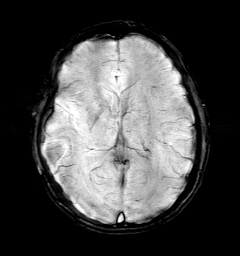
[im 36/36]
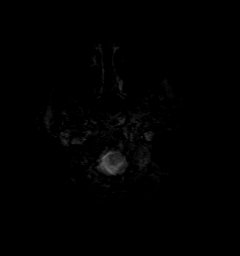

[Series 9: t1_mpr_tra · axial · 2.0mm · 0.45mm/px · z∈[-74,+4]mm · 4 of 80 slices shown]
[im 1/80]
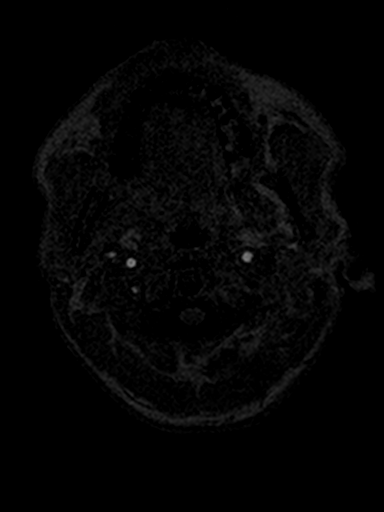
[im 14/80]
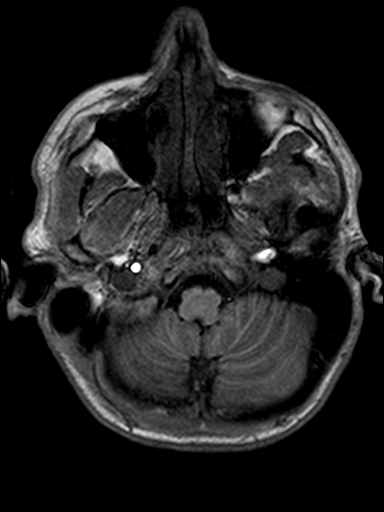
[im 27/80]
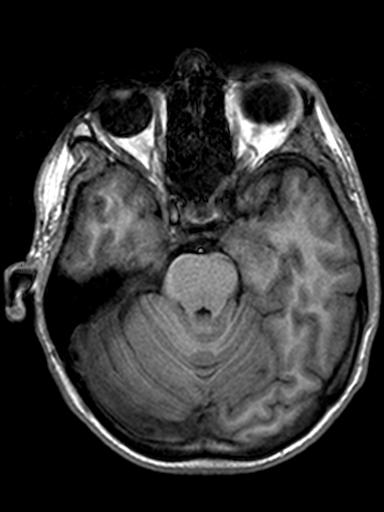
[im 40/80]
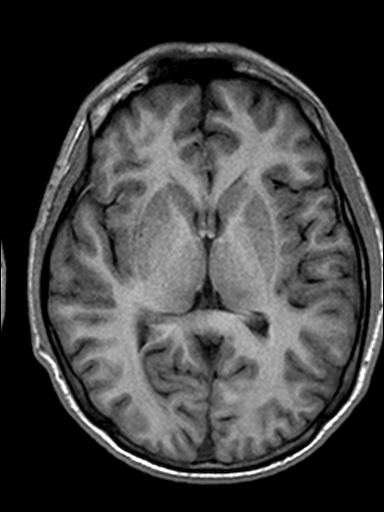

[Series 10: T2 · coronal · 3.0mm · 0.23mm/px · 2 of 28 slices shown (2 of 3)]
[im 1/28]
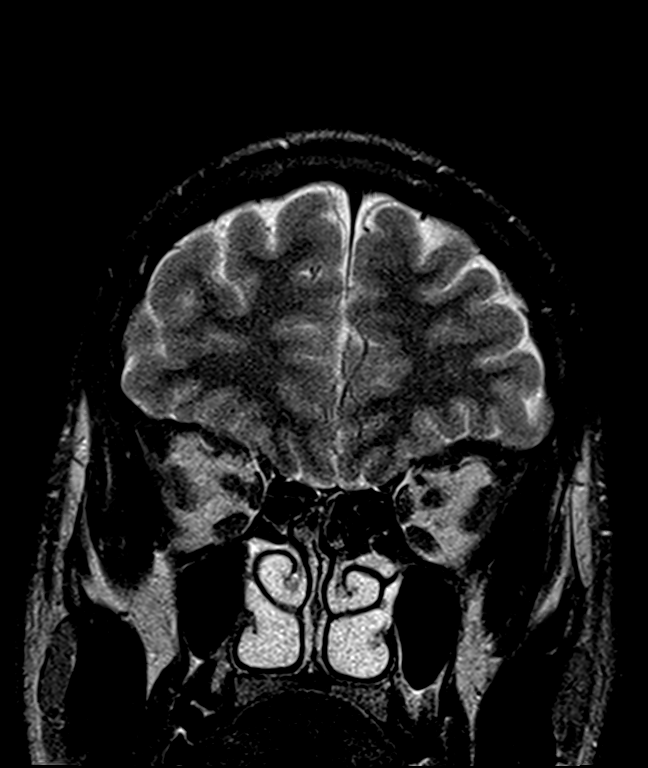
[im 28/28]
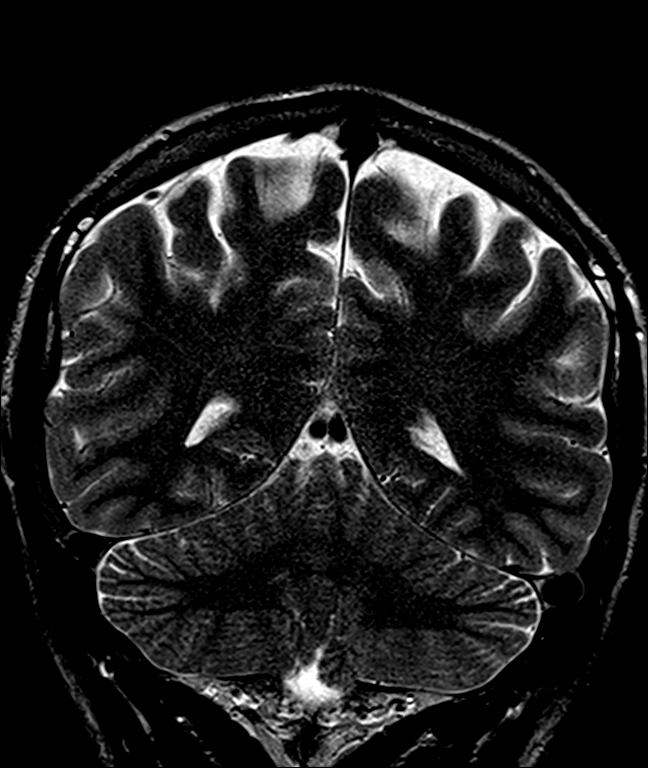

[Series 11: FLAIR · coronal · 3.0mm · 0.70mm/px · 2 of 28 slices shown (2 of 2)]
[im 1/28]
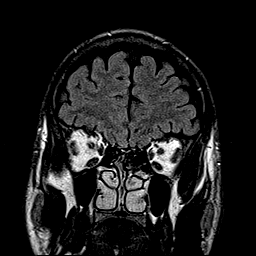
[im 28/28]
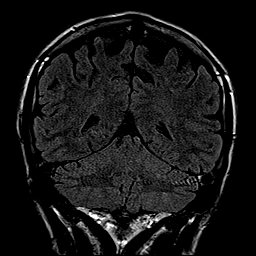

[Series 12: T2 · coronal · 5.0mm · 0.45mm/px · 3 of 29 slices shown (3 of 3)]
[im 1/29]
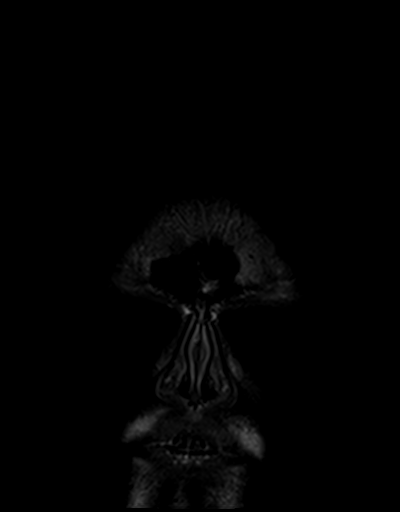
[im 15/29]
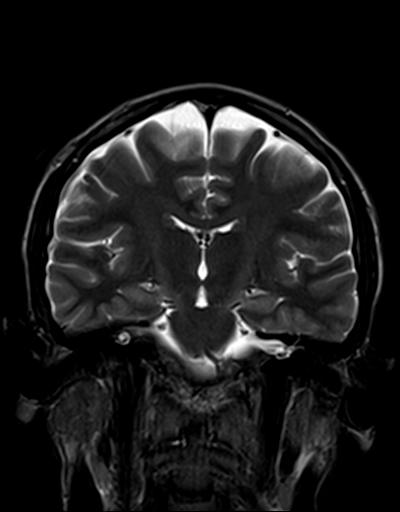
[im 29/29]
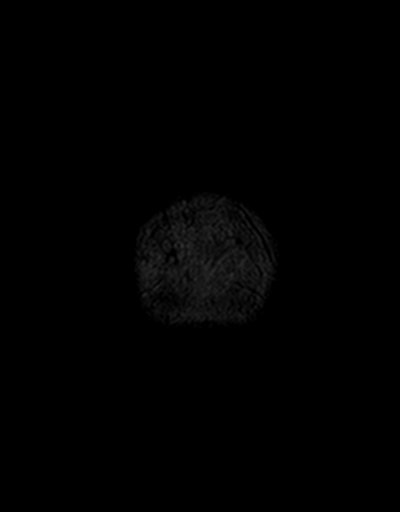

[35 of 48 positions shown; findings below may reference images not displayed]

FINDINGS: Brain: There is no acute intracranial hemorrhage, extra-axial fluid
collection, or acute infarct.

Parenchymal volume is normal. The ventricles are normal in size.
Gray-white differentiation is preserved. Parenchymal signal is
normal.

There is no structural or migration abnormality. The midline
structures are normal. The hippocampi are normal in signal and
architecture.

There is no abnormal enhancement. There is no mass lesion. There is
no mass effect or midline shift.

Vascular: Normal flow voids.

Skull and upper cervical spine: Normal marrow signal.

Sinuses/Orbits: The paranasal sinuses are clear. The globes and
orbits are unremarkable.

Other: None.
IMPRESSION: Unremarkable brain MRI with no acute intracranial pathology or
epileptogenic focus.
